# Patient Record
Sex: Female | Born: 1980 | Race: Black or African American | Hispanic: No | Marital: Single | Smoking: Former smoker
Health system: Southern US, Community
[De-identification: ages and names within clinical notes are randomized; demographics above are authoritative.]

## PROBLEM LIST (undated history)

## (undated) DIAGNOSIS — F410 Panic disorder [episodic paroxysmal anxiety] without agoraphobia: Secondary | ICD-10-CM

## (undated) DIAGNOSIS — F419 Anxiety disorder, unspecified: Secondary | ICD-10-CM

## (undated) DIAGNOSIS — E119 Type 2 diabetes mellitus without complications: Secondary | ICD-10-CM

## (undated) DIAGNOSIS — L709 Acne, unspecified: Secondary | ICD-10-CM

## (undated) DIAGNOSIS — R12 Heartburn: Secondary | ICD-10-CM

## (undated) DIAGNOSIS — F32A Depression, unspecified: Secondary | ICD-10-CM

## (undated) DIAGNOSIS — J45909 Unspecified asthma, uncomplicated: Secondary | ICD-10-CM

## (undated) DIAGNOSIS — F329 Major depressive disorder, single episode, unspecified: Secondary | ICD-10-CM

## (undated) DIAGNOSIS — I10 Essential (primary) hypertension: Secondary | ICD-10-CM

## (undated) DIAGNOSIS — K219 Gastro-esophageal reflux disease without esophagitis: Secondary | ICD-10-CM

## (undated) DIAGNOSIS — D649 Anemia, unspecified: Secondary | ICD-10-CM

## (undated) DIAGNOSIS — J302 Other seasonal allergic rhinitis: Secondary | ICD-10-CM

## (undated) HISTORY — PX: TOOTH EXTRACTION: SUR596

## (undated) HISTORY — PX: UTERINE FIBROID EMBOLIZATION: SHX825

---

## 2005-01-03 ENCOUNTER — Other Ambulatory Visit: Admission: RE | Admit: 2005-01-03 | Discharge: 2005-01-03 | Payer: Self-pay | Admitting: Obstetrics and Gynecology

## 2005-07-09 ENCOUNTER — Other Ambulatory Visit: Admission: RE | Admit: 2005-07-09 | Discharge: 2005-07-09 | Payer: Self-pay | Admitting: Obstetrics and Gynecology

## 2006-08-21 ENCOUNTER — Other Ambulatory Visit: Admission: RE | Admit: 2006-08-21 | Discharge: 2006-08-21 | Payer: Self-pay | Admitting: Obstetrics and Gynecology

## 2012-06-17 ENCOUNTER — Other Ambulatory Visit: Payer: Self-pay | Admitting: Nurse Practitioner

## 2012-06-17 DIAGNOSIS — D219 Benign neoplasm of connective and other soft tissue, unspecified: Secondary | ICD-10-CM

## 2012-06-17 DIAGNOSIS — N852 Hypertrophy of uterus: Secondary | ICD-10-CM

## 2012-06-23 ENCOUNTER — Ambulatory Visit (HOSPITAL_COMMUNITY)
Admission: RE | Admit: 2012-06-23 | Discharge: 2012-06-23 | Disposition: A | Discharge status: Home / Self Care | Payer: BC Managed Care – PPO | Source: Ambulatory Visit | Attending: Nurse Practitioner | Admitting: Nurse Practitioner

## 2012-06-23 DIAGNOSIS — D259 Leiomyoma of uterus, unspecified: Secondary | ICD-10-CM | POA: Insufficient documentation

## 2012-06-23 DIAGNOSIS — D219 Benign neoplasm of connective and other soft tissue, unspecified: Secondary | ICD-10-CM

## 2012-06-23 DIAGNOSIS — N852 Hypertrophy of uterus: Secondary | ICD-10-CM | POA: Insufficient documentation

## 2012-07-24 ENCOUNTER — Encounter (HOSPITAL_COMMUNITY): Payer: Self-pay | Admitting: Pharmacist

## 2012-07-28 ENCOUNTER — Encounter (HOSPITAL_COMMUNITY): Payer: Self-pay

## 2012-07-28 ENCOUNTER — Encounter (HOSPITAL_COMMUNITY)
Admission: RE | Admit: 2012-07-28 | Discharge: 2012-07-28 | Disposition: A | Discharge status: Home / Self Care | Payer: BC Managed Care – PPO | Source: Ambulatory Visit | Attending: Obstetrics and Gynecology | Admitting: Obstetrics and Gynecology

## 2012-07-28 DIAGNOSIS — Z01818 Encounter for other preprocedural examination: Secondary | ICD-10-CM | POA: Insufficient documentation

## 2012-07-28 DIAGNOSIS — Z01812 Encounter for preprocedural laboratory examination: Secondary | ICD-10-CM | POA: Insufficient documentation

## 2012-07-28 HISTORY — DX: Other seasonal allergic rhinitis: J30.2

## 2012-07-28 HISTORY — DX: Major depressive disorder, single episode, unspecified: F32.9

## 2012-07-28 HISTORY — DX: Gastro-esophageal reflux disease without esophagitis: K21.9

## 2012-07-28 HISTORY — DX: Essential (primary) hypertension: I10

## 2012-07-28 HISTORY — DX: Depression, unspecified: F32.A

## 2012-07-28 HISTORY — DX: Heartburn: R12

## 2012-07-28 HISTORY — DX: Panic disorder (episodic paroxysmal anxiety): F41.0

## 2012-07-28 HISTORY — DX: Unspecified asthma, uncomplicated: J45.909

## 2012-07-28 HISTORY — DX: Anxiety disorder, unspecified: F41.9

## 2012-07-28 HISTORY — DX: Type 2 diabetes mellitus without complications: E11.9

## 2012-07-28 HISTORY — DX: Anemia, unspecified: D64.9

## 2012-07-28 HISTORY — DX: Acne, unspecified: L70.9

## 2012-07-28 LAB — CBC
Hemoglobin: 10.5 g/dL — ABNORMAL LOW (ref 12.0–15.0)
MCH: 19.6 pg — ABNORMAL LOW (ref 26.0–34.0)
MCV: 67 fL — ABNORMAL LOW (ref 78.0–100.0)
RBC: 5.36 MIL/uL — ABNORMAL HIGH (ref 3.87–5.11)
WBC: 6.8 10*3/uL (ref 4.0–10.5)

## 2012-07-28 LAB — BASIC METABOLIC PANEL
CO2: 27 mEq/L (ref 19–32)
Calcium: 9.6 mg/dL (ref 8.4–10.5)
Chloride: 103 mEq/L (ref 96–112)
Glucose, Bld: 85 mg/dL (ref 70–99)
Sodium: 137 mEq/L (ref 135–145)

## 2012-07-28 NOTE — Patient Instructions (Addendum)
   Your procedure is scheduled on: Tuesday, July 29   Enter through the [employer] [facility] at:  12 Noon Pick up the phone at the desk and dial [phone] and inform us of your arrival.  Please call this number if you have any problems the morning of surgery: [phone]  Remember: Do not eat food after midnight: Monday Do not drink clear liquids after: 930 am Tuesday Take these medicines the morning of surgery with a SIP OF WATER: xanax, abilify, zyrtec, omeprazole.  Patient instructed to bring albuterol inhaler with her on day of surgery.  Do not wear jewelry, make-up, or FINGER nail polish No metal in your hair or on your body. Do not wear lotions, powders, perfumes. You may wear deodorant.  Please use your CHG wash as directed prior to surgery.  Do not shave anywhere for at least 12 hours prior to first CHG shower.  Do not bring valuables to the hospital. Contacts, dentures or bridgework may not be worn into surgery.  Leave suitcase in the car. After Surgery it may be brought to your room. For patients being admitted to the hospital, checkout time is 11:00am the day of discharge.  Home with parents Mr/Ms Vandeventer.

## 2012-07-29 ENCOUNTER — Other Ambulatory Visit: Payer: Self-pay | Admitting: Obstetrics and Gynecology

## 2012-08-04 ENCOUNTER — Inpatient Hospital Stay (HOSPITAL_COMMUNITY)
Admission: RE | Admit: 2012-08-04 | Discharge: 2012-08-07 | DRG: 358 | Disposition: A | Discharge status: Home / Self Care | Payer: BC Managed Care – PPO | Source: Ambulatory Visit | Attending: Obstetrics and Gynecology | Admitting: Obstetrics and Gynecology

## 2012-08-04 ENCOUNTER — Ambulatory Visit (HOSPITAL_COMMUNITY): Payer: BC Managed Care – PPO | Admitting: Anesthesiology

## 2012-08-04 ENCOUNTER — Encounter (HOSPITAL_COMMUNITY)
Admission: RE | Disposition: A | Discharge status: Home / Self Care | Payer: Self-pay | Source: Ambulatory Visit | Attending: Obstetrics and Gynecology

## 2012-08-04 ENCOUNTER — Encounter (HOSPITAL_COMMUNITY): Payer: Self-pay | Admitting: Anesthesiology

## 2012-08-04 DIAGNOSIS — D219 Benign neoplasm of connective and other soft tissue, unspecified: Secondary | ICD-10-CM

## 2012-08-04 DIAGNOSIS — J45909 Unspecified asthma, uncomplicated: Secondary | ICD-10-CM | POA: Diagnosis present

## 2012-08-04 DIAGNOSIS — D259 Leiomyoma of uterus, unspecified: Secondary | ICD-10-CM | POA: Diagnosis present

## 2012-08-04 DIAGNOSIS — IMO0002 Reserved for concepts with insufficient information to code with codable children: Secondary | ICD-10-CM | POA: Diagnosis not present

## 2012-08-04 DIAGNOSIS — N920 Excessive and frequent menstruation with regular cycle: Principal | ICD-10-CM | POA: Diagnosis present

## 2012-08-04 DIAGNOSIS — F329 Major depressive disorder, single episode, unspecified: Secondary | ICD-10-CM | POA: Diagnosis present

## 2012-08-04 DIAGNOSIS — Z9889 Other specified postprocedural states: Secondary | ICD-10-CM

## 2012-08-04 DIAGNOSIS — R34 Anuria and oliguria: Secondary | ICD-10-CM | POA: Diagnosis present

## 2012-08-04 DIAGNOSIS — I10 Essential (primary) hypertension: Secondary | ICD-10-CM | POA: Diagnosis present

## 2012-08-04 DIAGNOSIS — F3289 Other specified depressive episodes: Secondary | ICD-10-CM | POA: Diagnosis present

## 2012-08-04 DIAGNOSIS — Y658 Other specified misadventures during surgical and medical care: Secondary | ICD-10-CM | POA: Diagnosis not present

## 2012-08-04 DIAGNOSIS — F419 Anxiety disorder, unspecified: Secondary | ICD-10-CM | POA: Diagnosis present

## 2012-08-04 DIAGNOSIS — F411 Generalized anxiety disorder: Secondary | ICD-10-CM | POA: Diagnosis present

## 2012-08-04 DIAGNOSIS — D649 Anemia, unspecified: Secondary | ICD-10-CM | POA: Diagnosis present

## 2012-08-04 HISTORY — PX: MYOMECTOMY: SHX85

## 2012-08-04 LAB — BASIC METABOLIC PANEL
CO2: 26 mEq/L (ref 19–32)
Calcium: 9.9 mg/dL (ref 8.4–10.5)
Creatinine, Ser: 0.84 mg/dL (ref 0.50–1.10)
Glucose, Bld: 87 mg/dL (ref 70–99)

## 2012-08-04 LAB — ABO/RH: ABO/RH(D): B POS

## 2012-08-04 LAB — GLUCOSE, CAPILLARY

## 2012-08-04 LAB — CBC
HCT: 23 % — ABNORMAL LOW (ref 36.0–46.0)
MCH: 20.1 pg — ABNORMAL LOW (ref 26.0–34.0)
MCV: 69.1 fL — ABNORMAL LOW (ref 78.0–100.0)
Platelets: 188 10*3/uL (ref 150–400)
RDW: 25.7 % — ABNORMAL HIGH (ref 11.5–15.5)
WBC: 17.2 10*3/uL — ABNORMAL HIGH (ref 4.0–10.5)

## 2012-08-04 LAB — PREPARE RBC (CROSSMATCH)

## 2012-08-04 LAB — HEMOGLOBIN: Hemoglobin: 10.7 g/dL — ABNORMAL LOW (ref 12.0–15.0)

## 2012-08-04 SURGERY — MYOMECTOMY, ABDOMINAL APPROACH
Anesthesia: Choice | Site: Abdomen | Wound class: Clean Contaminated

## 2012-08-04 MED ORDER — PROPOFOL 10 MG/ML IV BOLUS
INTRAVENOUS | Status: DC | PRN
  Administered 2012-08-04: 200 mg via INTRAVENOUS

## 2012-08-04 MED ORDER — ONDANSETRON HCL 4 MG/2ML IJ SOLN
INTRAMUSCULAR | Status: DC | PRN
  Administered 2012-08-04: 4 mg via INTRAVENOUS

## 2012-08-04 MED ORDER — PANTOPRAZOLE SODIUM 40 MG PO TBEC
40.0000 mg | DELAYED_RELEASE_TABLET | Freq: Every day | ORAL | Status: DC
  Administered 2012-08-05 – 2012-08-07 (×2): 40 mg via ORAL
  Filled 2012-08-04 (×4): qty 1

## 2012-08-04 MED ORDER — FENTANYL CITRATE 0.05 MG/ML IJ SOLN
INTRAMUSCULAR | Status: AC
  Filled 2012-08-04: qty 2

## 2012-08-04 MED ORDER — NEOSTIGMINE METHYLSULFATE 1 MG/ML IJ SOLN
INTRAMUSCULAR | Status: DC | PRN
  Administered 2012-08-04: 4 mg via INTRAVENOUS

## 2012-08-04 MED ORDER — CITALOPRAM HYDROBROMIDE 40 MG PO TABS
40.0000 mg | ORAL_TABLET | Freq: Every day | ORAL | Status: DC
  Administered 2012-08-04 – 2012-08-06 (×3): 40 mg via ORAL
  Filled 2012-08-04 (×4): qty 1

## 2012-08-04 MED ORDER — NALOXONE HCL 0.4 MG/ML IJ SOLN: 0.4000 mg | INTRAMUSCULAR | Status: DC | PRN

## 2012-08-04 MED ORDER — PROPOFOL 10 MG/ML IV EMUL
INTRAVENOUS | Status: AC
  Filled 2012-08-04: qty 20

## 2012-08-04 MED ORDER — DIPHENHYDRAMINE HCL 50 MG/ML IJ SOLN: 12.5000 mg | Freq: Four times a day (QID) | INTRAMUSCULAR | Status: DC | PRN

## 2012-08-04 MED ORDER — HYDROMORPHONE 0.3 MG/ML IV SOLN
INTRAVENOUS | Status: DC
  Administered 2012-08-04: 22:00:00 via INTRAVENOUS
  Administered 2012-08-05: 1.8 mg via INTRAVENOUS
  Administered 2012-08-05: 1.5 mg via INTRAVENOUS
  Filled 2012-08-04: qty 25

## 2012-08-04 MED ORDER — HYDROMORPHONE HCL PF 1 MG/ML IJ SOLN
INTRAMUSCULAR | Status: AC
  Administered 2012-08-04: 0.25 mg via INTRAVENOUS
  Filled 2012-08-04: qty 1

## 2012-08-04 MED ORDER — MENTHOL 3 MG MT LOZG: 1.0000 | LOZENGE | OROMUCOSAL | Status: DC | PRN

## 2012-08-04 MED ORDER — LORATADINE 10 MG PO TABS
10.0000 mg | ORAL_TABLET | Freq: Every day | ORAL | Status: DC
  Administered 2012-08-05 – 2012-08-07 (×3): 10 mg via ORAL
  Filled 2012-08-04 (×4): qty 1

## 2012-08-04 MED ORDER — PHENYLEPHRINE HCL 10 MG/ML IJ SOLN
INTRAMUSCULAR | Status: DC | PRN
  Administered 2012-08-04 (×2): 80 ug via INTRAVENOUS
  Administered 2012-08-04 (×2): 40 ug via INTRAVENOUS
  Administered 2012-08-04 (×2): 80 ug via INTRAVENOUS

## 2012-08-04 MED ORDER — FENTANYL CITRATE 0.05 MG/ML IJ SOLN
INTRAMUSCULAR | Status: AC
  Filled 2012-08-04: qty 5

## 2012-08-04 MED ORDER — FUROSEMIDE 10 MG/ML IJ SOLN
INTRAMUSCULAR | Status: AC
  Filled 2012-08-04: qty 2

## 2012-08-04 MED ORDER — DEXAMETHASONE SODIUM PHOSPHATE 10 MG/ML IJ SOLN
INTRAMUSCULAR | Status: AC
  Filled 2012-08-04: qty 1

## 2012-08-04 MED ORDER — MIDAZOLAM HCL 2 MG/2ML IJ SOLN
INTRAMUSCULAR | Status: AC
  Filled 2012-08-04: qty 2

## 2012-08-04 MED ORDER — VASOPRESSIN 20 UNIT/ML IJ SOLN
INTRAVENOUS | Status: DC | PRN
  Administered 2012-08-04: 14:00:00 via INTRAMUSCULAR

## 2012-08-04 MED ORDER — LACTATED RINGERS IV SOLN
INTRAVENOUS | Status: DC
  Administered 2012-08-04 – 2012-08-05 (×3): via INTRAVENOUS

## 2012-08-04 MED ORDER — PHENYLEPHRINE 40 MCG/ML (10ML) SYRINGE FOR IV PUSH (FOR BLOOD PRESSURE SUPPORT)
PREFILLED_SYRINGE | INTRAVENOUS | Status: AC
  Filled 2012-08-04: qty 10

## 2012-08-04 MED ORDER — GLYCOPYRROLATE 0.2 MG/ML IJ SOLN
INTRAMUSCULAR | Status: AC
  Filled 2012-08-04: qty 4

## 2012-08-04 MED ORDER — HYDROMORPHONE HCL PF 1 MG/ML IJ SOLN
0.2500 mg | INTRAMUSCULAR | Status: DC | PRN
  Administered 2012-08-04: 0.5 mg via INTRAVENOUS
  Administered 2012-08-04: 0.25 mg via INTRAVENOUS

## 2012-08-04 MED ORDER — ONDANSETRON HCL 4 MG/2ML IJ SOLN
INTRAMUSCULAR | Status: AC
  Filled 2012-08-04: qty 2

## 2012-08-04 MED ORDER — ALBUTEROL SULFATE HFA 108 (90 BASE) MCG/ACT IN AERS: 2.0000 | INHALATION_SPRAY | RESPIRATORY_TRACT | Status: DC | PRN

## 2012-08-04 MED ORDER — DIPHENHYDRAMINE HCL 12.5 MG/5ML PO ELIX: 12.5000 mg | ORAL_SOLUTION | Freq: Four times a day (QID) | ORAL | Status: DC | PRN

## 2012-08-04 MED ORDER — ROCURONIUM BROMIDE 100 MG/10ML IV SOLN
INTRAVENOUS | Status: DC | PRN
  Administered 2012-08-04 (×2): 10 mg via INTRAVENOUS
  Administered 2012-08-04: 5 mg via INTRAVENOUS
  Administered 2012-08-04: 20 mg via INTRAVENOUS
  Administered 2012-08-04 (×2): 10 mg via INTRAVENOUS
  Administered 2012-08-04: 45 mg via INTRAVENOUS

## 2012-08-04 MED ORDER — DESOGESTREL-ETHINYL ESTRADIOL 0.15-30 MG-MCG PO TABS
1.0000 | ORAL_TABLET | Freq: Every day | ORAL | Status: DC
  Administered 2012-08-06 – 2012-08-07 (×2): 1 via ORAL

## 2012-08-04 MED ORDER — MIDAZOLAM HCL 5 MG/5ML IJ SOLN
INTRAMUSCULAR | Status: DC | PRN
  Administered 2012-08-04: 2 mg via INTRAVENOUS

## 2012-08-04 MED ORDER — NEOSTIGMINE METHYLSULFATE 1 MG/ML IJ SOLN
INTRAMUSCULAR | Status: AC
  Filled 2012-08-04: qty 1

## 2012-08-04 MED ORDER — ROCURONIUM BROMIDE 50 MG/5ML IV SOLN
INTRAVENOUS | Status: AC
Start: 2012-08-04 — End: 2012-08-04
  Filled 2012-08-04: qty 1

## 2012-08-04 MED ORDER — MEPERIDINE HCL 25 MG/ML IJ SOLN: 6.2500 mg | INTRAMUSCULAR | Status: DC | PRN

## 2012-08-04 MED ORDER — HYDROCODONE-ACETAMINOPHEN 5-325 MG PO TABS
1.0000 | ORAL_TABLET | ORAL | Status: DC | PRN
  Administered 2012-08-05 – 2012-08-06 (×4): 2 via ORAL
  Filled 2012-08-04 (×4): qty 2

## 2012-08-04 MED ORDER — ALPRAZOLAM 0.5 MG PO TABS: 0.5000 mg | ORAL_TABLET | Freq: Three times a day (TID) | ORAL | Status: DC | PRN

## 2012-08-04 MED ORDER — LACTATED RINGERS IV SOLN
INTRAVENOUS | Status: DC
  Administered 2012-08-04 (×3): via INTRAVENOUS

## 2012-08-04 MED ORDER — LIDOCAINE HCL (CARDIAC) 20 MG/ML IV SOLN
INTRAVENOUS | Status: AC
  Filled 2012-08-04: qty 5

## 2012-08-04 MED ORDER — ONDANSETRON HCL 4 MG PO TABS: 4.0000 mg | ORAL_TABLET | Freq: Four times a day (QID) | ORAL | Status: DC | PRN

## 2012-08-04 MED ORDER — FENTANYL CITRATE 0.05 MG/ML IJ SOLN
INTRAMUSCULAR | Status: DC | PRN
  Administered 2012-08-04 (×2): 100 ug via INTRAVENOUS
  Administered 2012-08-04 (×3): 50 ug via INTRAVENOUS

## 2012-08-04 MED ORDER — FUROSEMIDE 10 MG/ML IJ SOLN
20.0000 mg | Freq: Once | INTRAMUSCULAR | Status: AC
  Administered 2012-08-04: 20 mg via INTRAVENOUS

## 2012-08-04 MED ORDER — HYDROMORPHONE HCL PF 1 MG/ML IJ SOLN
INTRAMUSCULAR | Status: AC
  Filled 2012-08-04: qty 1

## 2012-08-04 MED ORDER — HYDROMORPHONE HCL PF 1 MG/ML IJ SOLN
INTRAMUSCULAR | Status: DC | PRN
  Administered 2012-08-04 (×2): 1 mg via INTRAVENOUS

## 2012-08-04 MED ORDER — METOCLOPRAMIDE HCL 5 MG/ML IJ SOLN
INTRAMUSCULAR | Status: AC
  Filled 2012-08-04: qty 2

## 2012-08-04 MED ORDER — SODIUM CHLORIDE 0.9 % IJ SOLN: 9.0000 mL | INTRAMUSCULAR | Status: DC | PRN

## 2012-08-04 MED ORDER — PNEUMOCOCCAL VAC POLYVALENT 25 MCG/0.5ML IJ INJ
0.5000 mL | INJECTION | INTRAMUSCULAR | Status: AC
Start: 2012-08-05 — End: 2012-08-06
  Filled 2012-08-04: qty 0.5

## 2012-08-04 MED ORDER — CEFAZOLIN SODIUM-DEXTROSE 2-3 GM-% IV SOLR
INTRAVENOUS | Status: AC
  Filled 2012-08-04: qty 50

## 2012-08-04 MED ORDER — SODIUM CHLORIDE 0.9 % IV SOLN
INTRAVENOUS | Status: DC | PRN
  Administered 2012-08-04 (×2): via INTRAVENOUS

## 2012-08-04 MED ORDER — MINOCYCLINE HCL 100 MG PO CAPS
100.0000 mg | ORAL_CAPSULE | Freq: Two times a day (BID) | ORAL | Status: DC
  Administered 2012-08-05 – 2012-08-07 (×5): 100 mg via ORAL
  Filled 2012-08-04 (×8): qty 1

## 2012-08-04 MED ORDER — ONDANSETRON HCL 4 MG/2ML IJ SOLN: 4.0000 mg | Freq: Four times a day (QID) | INTRAMUSCULAR | Status: DC | PRN

## 2012-08-04 MED ORDER — ONDANSETRON HCL 4 MG/2ML IJ SOLN
4.0000 mg | Freq: Four times a day (QID) | INTRAMUSCULAR | Status: DC | PRN
Start: 2012-08-04 — End: 2012-08-05

## 2012-08-04 MED ORDER — ARIPIPRAZOLE 2 MG PO TABS
2.0000 mg | ORAL_TABLET | Freq: Every day | ORAL | Status: DC
  Administered 2012-08-05 – 2012-08-07 (×3): 2 mg via ORAL
  Filled 2012-08-04 (×4): qty 1

## 2012-08-04 MED ORDER — METOCLOPRAMIDE HCL 5 MG/ML IJ SOLN
10.0000 mg | Freq: Once | INTRAMUSCULAR | Status: AC | PRN
  Administered 2012-08-04: 10 mg via INTRAVENOUS

## 2012-08-04 MED ORDER — CEFAZOLIN SODIUM-DEXTROSE 2-3 GM-% IV SOLR
2.0000 g | INTRAVENOUS | Status: AC
  Administered 2012-08-04: 2 g via INTRAVENOUS

## 2012-08-04 MED ORDER — DOCUSATE SODIUM 100 MG PO CAPS
100.0000 mg | ORAL_CAPSULE | Freq: Two times a day (BID) | ORAL | Status: DC
  Administered 2012-08-05 – 2012-08-07 (×5): 100 mg via ORAL
  Filled 2012-08-04 (×5): qty 1

## 2012-08-04 MED ORDER — SIMETHICONE 80 MG PO CHEW
80.0000 mg | CHEWABLE_TABLET | Freq: Four times a day (QID) | ORAL | Status: DC | PRN
  Administered 2012-08-06: 80 mg via ORAL

## 2012-08-04 MED ORDER — LIDOCAINE HCL (CARDIAC) 20 MG/ML IV SOLN
INTRAVENOUS | Status: DC | PRN
  Administered 2012-08-04: 60 mg via INTRAVENOUS

## 2012-08-04 MED ORDER — DEXAMETHASONE SODIUM PHOSPHATE 4 MG/ML IJ SOLN
INTRAMUSCULAR | Status: DC | PRN
  Administered 2012-08-04: 10 mg via INTRAVENOUS

## 2012-08-04 MED ORDER — GLYCOPYRROLATE 0.2 MG/ML IJ SOLN
INTRAMUSCULAR | Status: DC | PRN
  Administered 2012-08-04: 0.6 mg via INTRAVENOUS
  Administered 2012-08-04: 0.2 mg via INTRAVENOUS

## 2012-08-04 MED ORDER — SODIUM CHLORIDE 0.9 % IV BOLUS (SEPSIS): 500.0000 mL | Freq: Once | INTRAVENOUS | Status: DC

## 2012-08-04 MED ORDER — GLYCOPYRROLATE 0.2 MG/ML IJ SOLN
INTRAMUSCULAR | Status: AC
  Filled 2012-08-04: qty 1

## 2012-08-04 SURGICAL SUPPLY — 46 items
APPLICATOR COTTON TIP 6IN STRL (MISCELLANEOUS) ×2 IMPLANT
BARRIER ADHS 3X4 INTERCEED (GAUZE/BANDAGES/DRESSINGS) ×2 IMPLANT
CANISTER SUCTION 2500CC (MISCELLANEOUS) ×2 IMPLANT
CLOTH BEACON ORANGE TIMEOUT ST (SAFETY) ×2 IMPLANT
CONT PATH 16OZ SNAP LID 3702 (MISCELLANEOUS) ×2 IMPLANT
DECANTER SPIKE VIAL GLASS SM (MISCELLANEOUS) ×2 IMPLANT
DRSG OPSITE POSTOP 4X10 (GAUZE/BANDAGES/DRESSINGS) ×2 IMPLANT
GAUZE SPONGE 4X4 16PLY XRAY LF (GAUZE/BANDAGES/DRESSINGS) ×2 IMPLANT
GLOVE BIOGEL M 6.5 STRL (GLOVE) ×14 IMPLANT
GLOVE BIOGEL PI IND STRL 6.5 (GLOVE) ×7 IMPLANT
GLOVE BIOGEL PI INDICATOR 6.5 (GLOVE) ×7
GLOVE ECLIPSE 7.0 STRL STRAW (GLOVE) ×6 IMPLANT
GLOVE INDICATOR 7.0 STRL GRN (GLOVE) ×6 IMPLANT
GOWN PREVENTION PLUS XLARGE (GOWN DISPOSABLE) ×2 IMPLANT
NEEDLE HYPO 25X1 1.5 SAFETY (NEEDLE) ×2 IMPLANT
NS IRRIG 1000ML POUR BTL (IV SOLUTION) ×4 IMPLANT
PACK ABDOMINAL GYN (CUSTOM PROCEDURE TRAY) ×2 IMPLANT
PAD ABD 7.5X8 STRL (GAUZE/BANDAGES/DRESSINGS) ×2 IMPLANT
PAD OB MATERNITY 4.3X12.25 (PERSONAL CARE ITEMS) ×2 IMPLANT
SPONGE LAP 18X18 X RAY DECT (DISPOSABLE) ×22 IMPLANT
STAPLER VISISTAT 35W (STAPLE) ×2 IMPLANT
SUT MNCRL 0 VIOLET 6X18 (SUTURE) IMPLANT
SUT MNCRL+ AB 3-0 CT1 36 (SUTURE) ×1 IMPLANT
SUT MON AB 2-0 CT1 27 (SUTURE) IMPLANT
SUT MON AB 3-0 SH 27 (SUTURE) ×8
SUT MON AB 3-0 SH27 (SUTURE) ×8 IMPLANT
SUT MONOCRYL 0 6X18 (SUTURE)
SUT MONOCRYL AB 3-0 CT1 36IN (SUTURE) ×1
SUT PDS AB 0 CT 36 (SUTURE) IMPLANT
SUT PDS AB 0 CTX 60 (SUTURE) ×4 IMPLANT
SUT PLAIN 2 0 XLH (SUTURE) ×2 IMPLANT
SUT VIC AB 0 CT1 18XCR BRD8 (SUTURE) ×6 IMPLANT
SUT VIC AB 0 CT1 8-18 (SUTURE) ×6
SUT VIC AB 0 CTX 36 (SUTURE) ×2
SUT VIC AB 0 CTX36XBRD ANBCTRL (SUTURE) ×2 IMPLANT
SUT VIC AB 1 CT1 36 (SUTURE) ×2 IMPLANT
SUT VIC AB 2-0 CT1 27 (SUTURE) ×1
SUT VIC AB 2-0 CT1 TAPERPNT 27 (SUTURE) ×1 IMPLANT
SUT VIC AB 4-0 KS 27 (SUTURE) IMPLANT
SUT VIC AB 4-0 SH 27 (SUTURE)
SUT VIC AB 4-0 SH 27XANBCTRL (SUTURE) IMPLANT
SYR CONTROL 10ML LL (SYRINGE) ×2 IMPLANT
TAPE CLOTH SURG 4X10 WHT LF (GAUZE/BANDAGES/DRESSINGS) ×2 IMPLANT
TOWEL OR 17X24 6PK STRL BLUE (TOWEL DISPOSABLE) ×4 IMPLANT
TRAY FOLEY CATH 14FR (SET/KITS/TRAYS/PACK) ×2 IMPLANT
WATER STERILE IRR 1000ML POUR (IV SOLUTION) ×2 IMPLANT

## 2012-08-04 NOTE — OR Nursing (Signed)
Position of patient checked.  Patient skin warm and dry no noted pressure around extremities. Warming unit in progress.

## 2012-08-04 NOTE — Anesthesia Procedure Notes (Signed)
Procedure Name: Intubation Date/Time: 08/04/2012 1:54 PM Performed by: Isabella Bowens R Pre-anesthesia Checklist: Patient identified, Emergency Drugs available, Suction available, Patient being monitored and Timeout performed Patient Re-evaluated:Patient Re-evaluated prior to inductionOxygen Delivery Method: Circle system utilized Preoxygenation: Pre-oxygenation with 100% oxygen Intubation Type: IV induction Ventilation: Mask ventilation without difficulty Laryngoscope Size: Mac and 3 Grade View: Grade II Tube size: 7.0 mm Number of attempts: 1 Airway Equipment and Method: Stylet Placement Confirmation: ETT inserted through vocal cords under direct vision,  positive ETCO2 and breath sounds checked- equal and bilateral Secured at: 20 cm Tube secured with: Tape Dental Injury: Teeth and Oropharynx as per pre-operative assessment

## 2012-08-04 NOTE — Transfer of Care (Signed)
Immediate Anesthesia Transfer of Care Note  Patient: Alexandria Cole  Procedure(s) Performed: Procedure(s): MYOMECTOMY (N/A)  Patient Location: PACU  Anesthesia Type:General  Level of Consciousness: awake, alert  and oriented  Airway & Oxygen Therapy: Patient Spontanous Breathing and Patient connected to nasal cannula oxygen  Post-op Assessment: Report given to PACU RN and Post -op Vital signs reviewed and stable  Post vital signs: Reviewed and stable  Complications: No apparent anesthesia complications

## 2012-08-04 NOTE — Progress Notes (Addendum)
Day of Surgery POD#0  Procedure(s) (LRB): MYOMECTOMY (N/A)  Subjective: Patient is resting comfortably.  I did not disturb her.  Her mother was at bedside and said she seemed ok when she was awake.      Objective: I have reviewed patient's vital signs and intake and output. UOP in PACU over about 3hrs was 50cc (20cc before lasix and 30cc after lasix) UOP 50cc over the 1hr she has been on the floor  General: sleeping Resp: clear to auscultation bilaterally Cardio: regular rate and rhythm GI: soft, dressing c/d/i, decreased BS, ND Extremities: Homans sign is negative, no sign of DVT and scds are on Scant blood on pad  Hgb 11 at 8p  Assessment: s/p Procedure(s): MYOMECTOMY (N/A): clinically stable  Plan: Will continue to watch UOP Hgb being checked now If UOP < 30 cc/hr (will have urometer bag placed), will give 500cc NS fluid bolus and RN instructed to give me a call Pain is well controlled SCDs for DVT prophylaxis Routine post op care    LOS: 0 days    Ferdinando Lodge Y 08/04/2012, 10:36 PM

## 2012-08-04 NOTE — Op Note (Signed)
08/04/2012  6:41 PM  PATIENT:  Alexandria Cole  32 y.o. female  PRE-OPERATIVE DIAGNOSIS: 1  Uterine Fibroids 2. Menorrhagia 3 . Anemia   POST-OPERATIVE DIAGNOSIS:  Same   PROCEDURE:  Procedure(s): MYOMECTOMY (N/A)  SURGEON:  Surgeon(s) and Role:    * Ladasia Sircy J. Richardson Dopp, MD - Primary    * Geryl Rankins, MD - Assisting  PHYSICIAN ASSISTANT: NOne    ASSISTANTS: Dr. Geryl Rankins    ANESTHESIA:   general  EBL:  Total I/O In: 5500 [I.V.:4800; Blood:700] Out: 1555 [Urine:355; Blood:1200]  BLOOD ADMINISTERED:2 units of packed RBC's   DRAINS: Urinary Catheter (Foley)   LOCAL MEDICATIONS USED:  NONE  SPECIMEN:  Source of Specimen:  uterine fibroids   DISPOSITION OF SPECIMEN:  PATHOLOGY  COUNTS:  YES  TOURNIQUET:  * No tourniquets in log *  DICTATION: .Dragon Dictation  PLAN OF CARE: Admit to inpatient   PATIENT DISPOSITION:  PACU - hemodynamically stable.   Delay start of Pharmacological VTE agent (>24hrs) due to surgical blood loss or risk of bleeding: not applicable   Findings multiple uterine fibroids .Marland Kitchen Normal fallopian tubes and ovaries.   Procedure: Patient was taken to the operating room where she was placed and general anesthesia. Timeout was performed. She was prepped and draped in the usual sterile fashion. A midline incision was made with the scalpel. The incision was extended to the fascia with the Bovie. The fascia was incised and extended superiorly inferiorly. The peritoneum was identified and entered sharply. The peritoneal incision was extended superiorly inferiorly with good visualization of the bladder. The uterus was exteriorized. Patient was noted to have multiple uterine fibroids anteriorly posteriorly as well as fundal.  The anterior portion of the uterus was injected with vasopressin vertical incision was made approximately 10 cm in length multiple fibroids were removed via blunt and sharp dissection through this incision. The incision was  reapproximated with interrupted suture of 0 Vicryl. Several layers of suture were used to obtain hemostasis. The uterine serosa was reapproximated with 4-0 Monocryl. Attention was turned to the posterior portion of the uterus where a vertical incision was made approximately 10 cm in length. Again multiple uterine fibroids removed via blunt and sharp dissection. The endometrium was entered to removal of a posterior fibroid. This was in was reapproximated with 4-0 Monocryl. The myometrium was reapproximated with interrupted sutures of 0 Vicryl.  Patient was noted to have bleeding from the previous anterior incision with formation of what appeared to be a hematoma the previous incision was reopened sites of bleeding were noted and hemostasis was obtained with interrupted sutures of 0 Vicryl. The myometrium the anterior incision was reapproximated with interrupted 0 Vicryl suture. Excellent hemostasis was noted. The serosa was then reapproximated with 4-0 Monocryl.  Attention was turned to the posterior incision where the serosa was reapproximated with 4-0 Monocryl. During the surgery the patient had approximately 1200 cc of blood loss she received 2 units of packed red blood cells intraoperatively hemoglobin prior to transfusion was 6.7.  Interceed was placed along the uterine incisions. The uterus was returned to the pelvis. The fascia was reapproximated with 0 PDS in a running fashion. The subcutaneous adipose tissue was reapproximated with 2-0 Vicryl. The skin was closed with staples. Sponge lap needle counts were correct x2. The patient was taken to the recovery room awake and in stable condition.  EBL 1200 ml UOP 355 ml  Fluids 4000 ML of Crystalloid and 2 units of packed red blood cells.

## 2012-08-04 NOTE — Anesthesia Preprocedure Evaluation (Addendum)
Anesthesia Evaluation  Patient identified by MRN, date of birth, ID band Patient awake    Reviewed: Allergy & Precautions, H&P , NPO status   Airway Mallampati: III TM Distance: >3 FB Neck ROM: Full    Dental  (+) Chipped,    Pulmonary asthma , Current Smoker,  breath sounds clear to auscultation  Pulmonary exam normal       Cardiovascular hypertension, Rhythm:Regular Rate:Normal     Neuro/Psych PSYCHIATRIC DISORDERS Anxiety Depression Panic Attacks   GI/Hepatic Neg liver ROS, GERD-  Medicated and Controlled,  Endo/Other  diabetes, Well Controlled, Type 2Obesity  Renal/GU negative Renal ROS  negative genitourinary   Musculoskeletal negative musculoskeletal ROS (+)   Abdominal Normal abdominal exam  (+)   Peds  Hematology  (+) Blood dyscrasia, anemia ,   Anesthesia Other Findings   Reproductive/Obstetrics Fibroid Uterus                         Anesthesia Physical Anesthesia Plan  ASA: III  Anesthesia Plan: General   Post-op Pain Management:    Induction: Intravenous  Airway Management Planned: Oral ETT  Additional Equipment:   Intra-op Plan:   Post-operative Plan: Extubation in OR  Informed Consent: I have reviewed the patients History and Physical, chart, labs and discussed the procedure including the risks, benefits and alternatives for the proposed anesthesia with the patient or authorized representative who has indicated his/her understanding and acceptance.   Dental advisory given  Plan Discussed with: Anesthesiologist, CRNA and Surgeon  Anesthesia Plan Comments:         Anesthesia Quick Evaluation

## 2012-08-04 NOTE — Anesthesia Postprocedure Evaluation (Signed)
  Anesthesia Post Note  Patient: Alexandria Cole  Procedure(s) Performed: Procedure(s) (LRB): MYOMECTOMY (N/A)  Anesthesia type: GA  Patient location: PACU  Post pain: Pain level controlled  Post assessment: Post-op Vital signs reviewed  Last Vitals:  Filed Vitals:   08/04/12 1930  BP: 132/88  Pulse: 95  Temp: 37.3 C  Resp: 12    Post vital signs: Reviewed  Level of consciousness: sedated  Complications: No apparent anesthesia complications

## 2012-08-04 NOTE — H&P (Signed)
Date of Initial H&P: 07/29/2012  History reviewed, patient examined, no change in status, stable for surgery. Pt has  Signed an informed consent for blood products if necessary.

## 2012-08-05 DIAGNOSIS — N920 Excessive and frequent menstruation with regular cycle: Secondary | ICD-10-CM | POA: Diagnosis present

## 2012-08-05 DIAGNOSIS — I10 Essential (primary) hypertension: Secondary | ICD-10-CM | POA: Diagnosis present

## 2012-08-05 DIAGNOSIS — F419 Anxiety disorder, unspecified: Secondary | ICD-10-CM | POA: Diagnosis present

## 2012-08-05 DIAGNOSIS — D219 Benign neoplasm of connective and other soft tissue, unspecified: Secondary | ICD-10-CM | POA: Diagnosis present

## 2012-08-05 DIAGNOSIS — D649 Anemia, unspecified: Secondary | ICD-10-CM | POA: Diagnosis present

## 2012-08-05 DIAGNOSIS — J45909 Unspecified asthma, uncomplicated: Secondary | ICD-10-CM | POA: Diagnosis present

## 2012-08-05 LAB — BASIC METABOLIC PANEL
BUN: 13 mg/dL (ref 6–23)
Calcium: 7.3 mg/dL — ABNORMAL LOW (ref 8.4–10.5)
GFR calc Af Amer: 39 mL/min — ABNORMAL LOW (ref >90)
GFR calc non Af Amer: 33 mL/min — ABNORMAL LOW (ref >90)
Glucose, Bld: 146 mg/dL — ABNORMAL HIGH (ref 70–99)
Sodium: 131 mEq/L — ABNORMAL LOW (ref 135–145)

## 2012-08-05 LAB — TYPE AND SCREEN

## 2012-08-05 LAB — HEMOGLOBIN AND HEMATOCRIT, BLOOD
HCT: 34.7 % — ABNORMAL LOW (ref 36.0–46.0)
Hemoglobin: 11 g/dL — ABNORMAL LOW (ref 12.0–15.0)

## 2012-08-05 MED ORDER — LACTATED RINGERS IV BOLUS (SEPSIS)
500.0000 mL | Freq: Once | INTRAVENOUS | Status: AC
  Administered 2012-08-05: 500 mL via INTRAVENOUS

## 2012-08-05 NOTE — Progress Notes (Signed)
1 Day Post-Op s/p Abdominal myomectomy  Subjective: Pt reports pain is controlled with PCA. She denies flatus . No nausea or vomiting. Slightly dizzy last night when trying to ambulate however this resolved.. Has pain under right rib that feels like gas pain.  She had minimal urine output overnight.   Objective: Vital signs in last 24 hours: Temp:  [98 F (36.7 C)-99.2 F (37.3 C)] 98.6 F (37 C) (07/30 0941) Pulse Rate:  [85-102] 85 (07/30 0941) Resp:  [12-22] 18 (07/30 0941) BP: (119-151)/(64-89) 138/82 mmHg (07/30 0941) SpO2:  [98 %-100 %] 99 % (07/30 0941) Weight:  [98.884 kg (218 lb)] 98.884 kg (218 lb) (07/29 2258) Last BM Date: 08/04/12  Intake/Output from previous day: 07/29 0701 - 07/30 0700 In: 8764.2 [P.O.:840; I.V.:7224.2; Blood:700] Out: 1862 [Urine:662; Blood:1200] Intake/Output this shift: Total I/O In: -  Out: 343 [Urine:343]  General appearance: alert and no distress Cardio: regular rate and rhythm, S1, S2 normal, no murmur, click, rub or gallop GI: soft appropriately tender slightly distended +BS.Marland Kitchen bandage is clean dry and intact.  Extremities: extremities normal, atraumatic, no cyanosis or edema  Lab Results:   Recent Labs  08/04/12 1650 08/04/12 1954 08/04/12 2235 08/05/12 0540  WBC 17.2*  --   --  16.6*  HGB 6.7* 11.0* 10.7* 8.9*  HCT 23.0* 34.7*  --  27.8*  PLT 188  --   --  118*   BMET  Recent Labs  08/04/12 1230 08/05/12 0540  NA 137 131*  K 4.3 6.1*  CL 103 104  CO2 26 22  GLUCOSE 87 146*  BUN 5* 13  CREATININE 0.84 1.91*  CALCIUM 9.9 7.3*   PT/INR No results found for this basename: LABPROT, INR,  in the last 72 hours ABG No results found for this basename: PHART, PCO2, PO2, HCO3,  in the last 72 hours  Studies/Results: No results found.  Anti-infectives: Anti-infectives   Start     Dose/Rate Route Frequency Ordered Stop   08/04/12 2215  minocycline (MINOCIN,DYNACIN) capsule 100 mg     100 mg Oral 2 times daily 08/04/12  2207     08/04/12 1249  ceFAZolin (ANCEF) 2-3 GM-% IVPB SOLR    Comments:  KASMAR,  NANCY G: cabinet override      08/04/12 1249 08/05/12 0059   08/04/12 0148  ceFAZolin (ANCEF) IVPB 2 g/50 mL premix     2 g 100 mL/hr over 30 Minutes Intravenous On call to O.R. 08/04/12 0148 08/04/12 1348      Assessment/Plan: s/p Procedure(s): MYOMECTOMY (N/A) Advance diet Continue foley due to urinary output monitoring Continue IV fluids due to oliguria.  Increased creatinine most likely due to blood loss during surgery... The urine output is improving.. Repeat creatinine in am  And repeat hgb in am;.  Encourage ambulation.  D/c dilaudid pca this afternoon  LOS: 1 day    Alexandria Cole J. 08/05/2012

## 2012-08-05 NOTE — Progress Notes (Signed)
Ur chart review completed.  

## 2012-08-05 NOTE — Anesthesia Postprocedure Evaluation (Signed)
  Anesthesia Post-op Note  Anesthesia Post Note  Patient: Alexandria Cole  Procedure(s) Performed: Procedure(s) (LRB): MYOMECTOMY (N/A)  Anesthesia type: General  Patient location: [facility]  Post pain: Pain level controlled  Post assessment: Post-op Vital signs reviewed  Last Vitals:  Filed Vitals:   08/05/12 0629  BP:   Pulse:   Temp:   Resp: 20    Post vital signs: Reviewed  Level of consciousness: sedated  Complications: No apparent anesthesia complications

## 2012-08-06 LAB — CBC
HCT: 27.8 % — ABNORMAL LOW (ref 36.0–46.0)
Hemoglobin: 8.9 g/dL — ABNORMAL LOW (ref 12.0–15.0)
MCH: 24 pg — ABNORMAL LOW (ref 26.0–34.0)
MCH: 23.9 pg — ABNORMAL LOW (ref 26.0–34.0)
MCHC: 32 g/dL (ref 30.0–36.0)
MCHC: 32 g/dL (ref 30.0–36.0)
Platelets: 121 10*3/uL — ABNORMAL LOW (ref 150–400)
RDW: 23.9 % — ABNORMAL HIGH (ref 11.5–15.5)

## 2012-08-06 LAB — BASIC METABOLIC PANEL
Calcium: 7.6 mg/dL — ABNORMAL LOW (ref 8.4–10.5)
GFR calc Af Amer: 56 mL/min — ABNORMAL LOW (ref >90)
GFR calc non Af Amer: 48 mL/min — ABNORMAL LOW (ref >90)
Potassium: 4.2 mEq/L (ref 3.5–5.1)
Sodium: 138 mEq/L (ref 135–145)

## 2012-08-06 MED ORDER — IBUPROFEN 600 MG PO TABS
600.0000 mg | ORAL_TABLET | Freq: Four times a day (QID) | ORAL | Status: DC
  Administered 2012-08-06 – 2012-08-07 (×6): 600 mg via ORAL
  Filled 2012-08-06 (×6): qty 1

## 2012-08-06 MED ORDER — HYDROMORPHONE HCL PF 1 MG/ML IJ SOLN
1.0000 mg | INTRAMUSCULAR | Status: DC | PRN
  Administered 2012-08-06: 1 mg via INTRAVENOUS
  Filled 2012-08-06: qty 1

## 2012-08-06 MED ORDER — BISACODYL 10 MG RE SUPP
10.0000 mg | Freq: Once | RECTAL | Status: AC
  Administered 2012-08-06: 10 mg via RECTAL
  Filled 2012-08-06: qty 1

## 2012-08-06 NOTE — Progress Notes (Signed)
2 Days Post-Op  S/p Abdominal myomectomy with intra operative hemorrhage of 1200 ml requiring 2 units of pRBC transfusion  Subjective: Pain 5 out of ten with vidodin alone. Pain 7 when ambulating. No flatus. Tolerating po no nausea or emesis.  Voiding without difficulty. No dizziness no chest pain. Reports gas pain.   Objective: Vital signs in last 24 hours: Temp:  [97.9 F (36.6 C)-99.1 F (37.3 C)] 99.1 F (37.3 C) (07/31 0945) Pulse Rate:  [83-101] 92 (07/31 0945) Resp:  [16-18] 18 (07/31 0945) BP: (120-147)/(62-81) 132/78 mmHg (07/31 0945) SpO2:  [96 %-100 %] 98 % (07/31 0945) Last BM Date: 08/04/12  Intake/Output from previous day: 07/30 0701 - 07/31 0700 In: 760 [P.O.:760] Out: 4108 [Urine:4108] Intake/Output this shift: Total I/O In: -  Out: 950 [Urine:950]  General appearance: alert and cooperative Resp: clear to auscultation bilaterally Cardio: regular rate and rhythm, S1, S2 normal, no murmur, click, rub or gallop GI: soft appropriately tender nondistended + BS no rebound no guarding. .. bandage is clean dry and intact  Extremities: extremities normal, atraumatic, no cyanosis or edema  Lab Results:   Recent Labs  08/05/12 0540 08/06/12 0525  WBC 16.6* 14.3*  HGB 8.9* 7.4*  HCT 27.8* 23.1*  PLT 118* 121*   BMET  Recent Labs  08/05/12 0540 08/06/12 0525  NA 131* 138  K 6.1* 4.2  CL 104 105  CO2 22 26  GLUCOSE 146* 125*  BUN 13 11  CREATININE 1.91* 1.40*  CALCIUM 7.3* 7.6*   PT/INR No results found for this basename: LABPROT, INR,  in the last 72 hours ABG No results found for this basename: PHART, PCO2, PO2, HCO3,  in the last 72 hours  Studies/Results: No results found.  Anti-infectives: Anti-infectives   Start     Dose/Rate Route Frequency Ordered Stop   08/04/12 2215  minocycline (MINOCIN,DYNACIN) capsule 100 mg     100 mg Oral 2 times daily 08/04/12 2207     08/04/12 1249  ceFAZolin (ANCEF) 2-3 GM-% IVPB SOLR    Comments:  KASMAR,   NANCY G: cabinet override      08/04/12 1249 08/05/12 0059   08/04/12 0148  ceFAZolin (ANCEF) IVPB 2 g/50 mL premix     2 g 100 mL/hr over 30 Minutes Intravenous On call to O.R. 08/04/12 0148 08/04/12 1348      Assessment/Plan: s/p Procedure(s): MYOMECTOMY (N/A) Plan for discharge tomorrow awaiting return of bowel function and plan to optomize pain control  Dulcolax suppository x 1 now  Encourage ambulation.  Creatinine is improving. Repeat bmet in am   Anemia. Start Iron bid HGb slightly declined most likely dilutional from IV fluids. Plan repeat in am   LOS: 2 days    Merleen Picazo J. 08/06/2012

## 2012-08-07 LAB — CBC
HCT: 23.3 % — ABNORMAL LOW (ref 36.0–46.0)
Hemoglobin: 7.3 g/dL — ABNORMAL LOW (ref 12.0–15.0)
Platelets: 129 10*3/uL — ABNORMAL LOW (ref 150–400)
RDW: 25.1 % — ABNORMAL HIGH (ref 11.5–15.5)
RDW: 24.8 % — ABNORMAL HIGH (ref 11.5–15.5)
WBC: 12.9 10*3/uL — ABNORMAL HIGH (ref 4.0–10.5)
WBC: 13.4 10*3/uL — ABNORMAL HIGH (ref 4.0–10.5)

## 2012-08-07 LAB — BASIC METABOLIC PANEL
Chloride: 106 mEq/L (ref 96–112)
Creatinine, Ser: 1.05 mg/dL (ref 0.50–1.10)
GFR calc Af Amer: 79 mL/min — ABNORMAL LOW (ref >90)
GFR calc non Af Amer: 68 mL/min — ABNORMAL LOW (ref >90)
Potassium: 3.5 mEq/L (ref 3.5–5.1)

## 2012-08-07 MED ORDER — FERROUS SULFATE 325 (65 FE) MG PO TABS
325.0000 mg | ORAL_TABLET | ORAL | Status: DC
  Administered 2012-08-07 (×2): 325 mg via ORAL
  Filled 2012-08-07 (×2): qty 1

## 2012-08-07 MED ORDER — FERROUS SULFATE 325 (65 FE) MG PO TABS: 325.0000 mg | ORAL_TABLET | Freq: Three times a day (TID) | ORAL | Status: DC

## 2012-08-07 MED ORDER — IBUPROFEN 600 MG PO TABS: 600.0000 mg | ORAL_TABLET | Freq: Four times a day (QID) | ORAL | Status: DC | PRN

## 2012-08-07 MED ORDER — HYDROCODONE-ACETAMINOPHEN 5-325 MG PO TABS: 1.0000 | ORAL_TABLET | Freq: Four times a day (QID) | ORAL | Status: DC | PRN

## 2012-08-07 NOTE — Discharge Summary (Signed)
Physician Discharge Summary  Patient ID: Alexandria Cole MRN: [id number] DOB/AGE: 34-Apr-1982 31 y.o.  Admit date: 08/04/2012 Discharge date: 08/07/2012  Admission Diagnoses:1. Uterine fibroids 2. Anemia 3. Menorrhagia 4. Hypertension 5. Asthma 6. Anxiety and depression   Discharge Diagnoses: s/p myomectomy  Active Problems:   Fibroids   Menorrhagia   Anemia   High blood pressure   Asthma   Anxiety and depression   Discharged Condition: stable  Hospital Course: Pt was admitted after abdominal myomectomy on 08/04/2012. She had an intraoperative hemorrhage of 1200 ml. She received 2 units of packed rbc's during surgery. She developed oliguria postoperatively with increased creatinine of 1.9 .. This decreased to 1.0 by pod #3. She had moderate vaginal bleeding after surgery. Her hgb at discharge was 7.3. She was asymptomatic. Iron supplementation was initiated.   Consults: None  Significant Diagnostic Studies: labs: hgb 7.3 on discharge   Treatments: surgery: abdominal myomectomy   Discharge Exam: Blood pressure 145/60, pulse 92, temperature 98.8 F (37.1 C), temperature source Oral, resp. rate 18, height 5\' 3"  (1.6 m), weight 98.884 kg (218 lb), last menstrual period 07/17/2012, SpO2 100.00%.  Disposition: Final discharge disposition not confirmed  Discharge Orders   Future Orders Complete By Expires     Call MD for:  persistant nausea and vomiting  As directed     Call MD for:  redness, tenderness, or signs of infection (pain, swelling, redness, odor or green/yellow discharge around incision site)  As directed     Call MD for:  severe uncontrolled pain  As directed     Call MD for:  temperature >100.4  As directed     Diet - low sodium heart healthy  As directed     Driving Restrictions  As directed     Comments:      Avoid driving for 2 weeks    Increase activity slowly  As directed     Lifting restrictions  As directed     Comments:      Avoid lifting over 10 lbs    Sexual Activity Restrictions  As directed     Comments:      Avoid sex in 6 weeks        Medication List         adapalene 0.1 % gel  Commonly known as:  DIFFERIN  Apply 1 application topically daily as needed (for acne).     albuterol 108 (90 BASE) MCG/ACT inhaler  Commonly known as:  PROVENTIL HFA;VENTOLIN HFA  Inhale 2 puffs into the lungs every 6 (six) hours as needed (allergies).     ALPRAZolam 0.5 MG tablet  Commonly known as:  XANAX  Take 0.5 mg by mouth daily as needed for anxiety.     ARIPiprazole 2 MG tablet  Commonly known as:  ABILIFY  Take 2 mg by mouth daily.     Biotin 10 MG Caps  Take 10 mg by mouth daily.     calcium carbonate 500 MG chewable tablet  Commonly known as:  TUMS - dosed in mg elemental calcium  Chew 1 tablet by mouth daily as needed for heartburn.     cetirizine 10 MG tablet  Commonly known as:  ZYRTEC  Take 10 mg by mouth daily.     citalopram 40 MG tablet  Commonly known as:  CELEXA  Take 40 mg by mouth at bedtime.     desogestrel-ethinyl estradiol 0.15-30 MG-MCG tablet  Commonly known as:  APRI,EMOQUETTE,SOLIA  Take 1 tablet by  mouth daily.     docusate sodium 100 MG capsule  Commonly known as:  COLACE  Take 200 mg by mouth daily.     estazolam 2 MG tablet  Commonly known as:  PROSOM  Take 4 mg by mouth at bedtime.     HYDROcodone-acetaminophen 5-325 MG per tablet  Commonly known as:  NORCO/VICODIN  Take 1-2 tablets by mouth every 6 (six) hours as needed.     ibuprofen 600 MG tablet  Commonly known as:  ADVIL,MOTRIN  Take 1 tablet (600 mg total) by mouth every 6 (six) hours as needed for pain.     IRON PO  Take 2 tablets by mouth daily.     minocycline 100 MG capsule  Commonly known as:  MINOCIN,DYNACIN  Take 100 mg by mouth 2 (two) times daily.     naphazoline-pheniramine 0.025-0.3 % ophthalmic solution  Commonly known as:  NAPHCON-A  Place 1 drop into both eyes daily as needed (for itchy eyes).     omeprazole 20  MG capsule  Commonly known as:  PRILOSEC  Take 20 mg by mouth daily.     oxymetazoline 0.05 % nasal spray  Commonly known as:  AFRIN  Place 2 sprays into the nose 2 (two) times a week.           Follow-up Information   Follow up with Jessee Avers., MD. Schedule an appointment as soon as possible for a visit on 08/11/2012. (staple removal )    Contact information:   [address] [place] [address] [phone]       Signed: Jessee Avers. 08/07/2012, 5:20 PM

## 2012-08-07 NOTE — Progress Notes (Signed)
Discharge instructions provided to patient at bedside.  Activity, medications, when to call the doctor, follow up appointments, incision care and community resources discussed.  No questions at this time.  Patient left unit in stable condition with all personal belongings in stable condition with prescriptions.  Osvaldo Angst, RN--------------

## 2012-08-07 NOTE — Progress Notes (Signed)
3 Days Post-Op s/p Myomectomy with 1200 ml intraoperative hemorrhage.  Subjective: Pt had vaginal bleeding that was  Moderate for the last 3 days... She reports scant bleeding today. Denies headache or dizziness. " feels sleepy"  Objective: Vital signs in last 24 hours: Temp:  [98 F (36.7 C)-99.9 F (37.7 C)] 98.8 F (37.1 C) (08/01 1400) Pulse Rate:  [82-94] 92 (08/01 1400) Resp:  [18-20] 18 (08/01 1400) BP: (124-145)/(60-82) 145/60 mmHg (08/01 1400) SpO2:  [98 %-100 %] 100 % (08/01 1400) Last BM Date: 09/04/12  Intake/Output from previous day: 07/31 0701 - 08/01 0700 In: -  Out: 2950 [Urine:2950] Intake/Output this shift: Total I/O In: 480 [P.O.:480] Out: -   General appearance: alert and cooperative Resp: clear to auscultation bilaterally Cardio: regular rate and rhythm, S1, S2 normal, no murmur, click, rub or gallop GI: soft appropriately tender nondistended +BS .. incision is healing well.. no exudate no erythema  Extremities: extremities normal, atraumatic, no cyanosis or edema  Lab Results:   Recent Labs  08/07/12 0505 08/07/12 1205  WBC 12.9* 13.4*  HGB 6.4* 7.3*  HCT 20.6* 23.3*  PLT 129* 167   BMET  Recent Labs  08/06/12 0525 08/07/12 0505  NA 138 136  K 4.2 3.5  CL 105 106  CO2 26 23  GLUCOSE 125* 89  BUN 11 9  CREATININE 1.40* 1.05  CALCIUM 7.6* 7.7*   PT/INR No results found for this basename: LABPROT, INR,  in the last 72 hours ABG No results found for this basename: PHART, PCO2, PO2, HCO3,  in the last 72 hours  Studies/Results: No results found.  Anti-infectives: Anti-infectives   Start     Dose/Rate Route Frequency Ordered Stop   08/04/12 2215  minocycline (MINOCIN,DYNACIN) capsule 100 mg     100 mg Oral 2 times daily 08/04/12 2207     08/04/12 1249  ceFAZolin (ANCEF) 2-3 GM-% IVPB SOLR    Comments:  KASMAR,  NANCY G: cabinet override      08/04/12 1249 08/05/12 0059   08/04/12 0148  ceFAZolin (ANCEF) IVPB 2 g/50 mL premix      2 g 100 mL/hr over 30 Minutes Intravenous On call to O.R. 08/04/12 0148 08/04/12 1348      Assessment/Plan: s/p Procedure(s): MYOMECTOMY (N/A) Discharge continue iron therapy at home plan for stable removal in the office on tuesday August 5th  LOS: 3 days    Ivey Cina J. 08/07/2012

## 2013-12-13 ENCOUNTER — Other Ambulatory Visit (HOSPITAL_COMMUNITY)
Admission: RE | Admit: 2013-12-13 | Discharge: 2013-12-13 | Disposition: A | Discharge status: Home / Self Care | Payer: BC Managed Care – PPO | Source: Ambulatory Visit | Attending: Obstetrics and Gynecology | Admitting: Obstetrics and Gynecology

## 2013-12-13 DIAGNOSIS — R87810 Cervical high risk human papillomavirus (HPV) DNA test positive: Secondary | ICD-10-CM | POA: Insufficient documentation

## 2013-12-13 DIAGNOSIS — Z1151 Encounter for screening for human papillomavirus (HPV): Secondary | ICD-10-CM | POA: Insufficient documentation

## 2013-12-13 DIAGNOSIS — Z01419 Encounter for gynecological examination (general) (routine) without abnormal findings: Secondary | ICD-10-CM | POA: Diagnosis not present

## 2013-12-14 ENCOUNTER — Other Ambulatory Visit: Payer: Self-pay | Admitting: Obstetrics and Gynecology

## 2013-12-20 LAB — CYTOLOGY - PAP

## 2014-05-10 IMAGING — US US TRANSVAGINAL NON-OB
1 series · 13 of 25 positions shown · non-contrast
Comparison: None.

CLINICAL DATA: Enlarged uterus.  Fibroids.  LMP 05/20/2012



[Series 1: us pelvis complete · 13 of 50 slices shown]
[im 1/50]
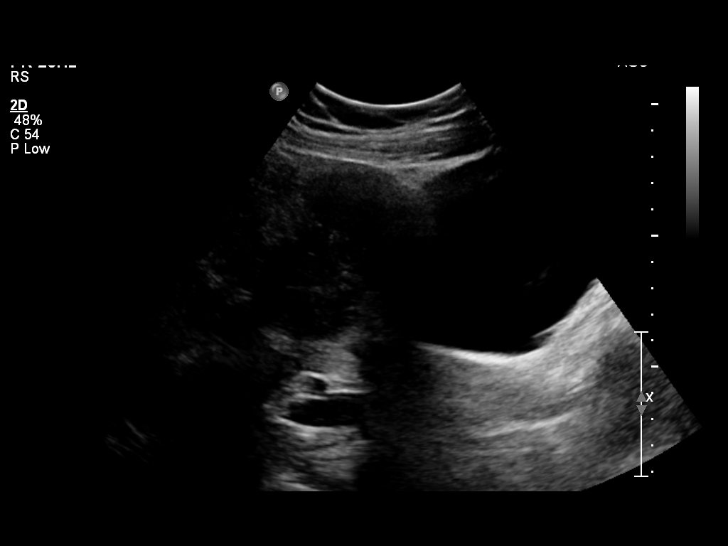
[im 5/50]
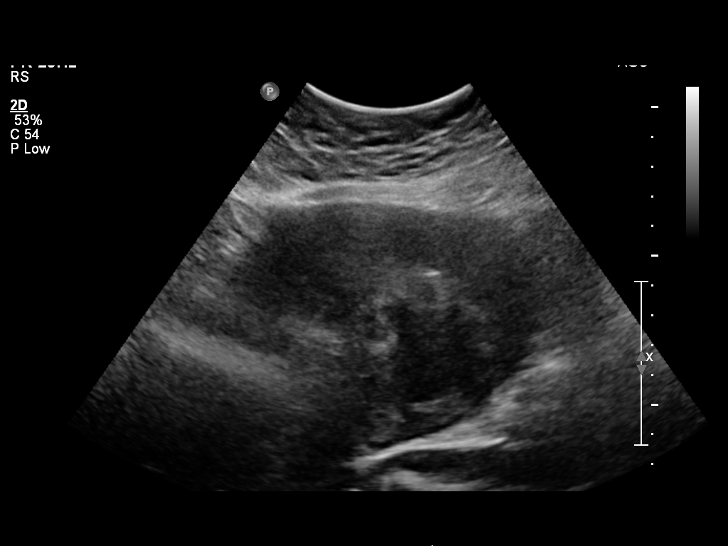
[im 9/50]
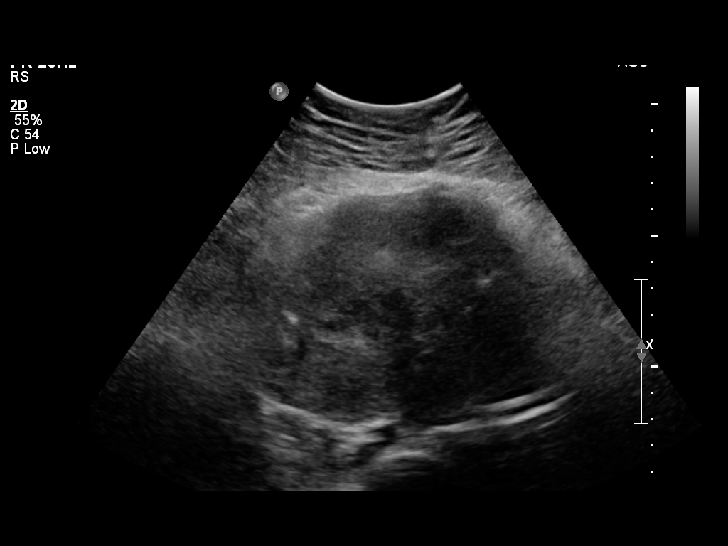
[im 13/50]
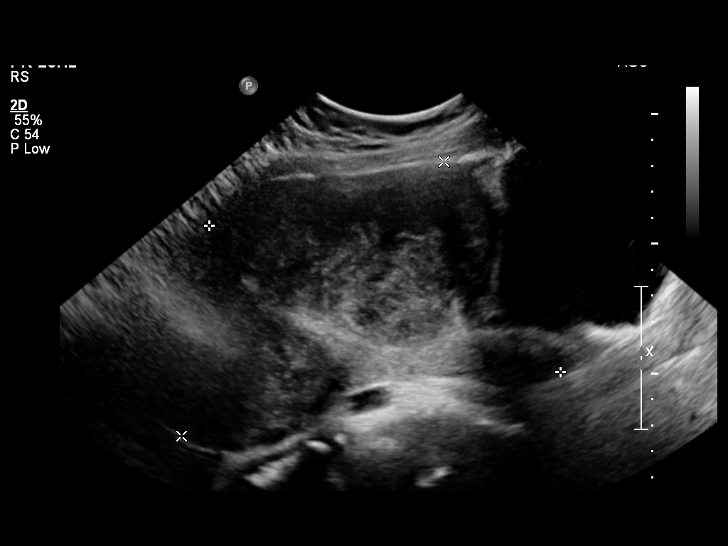
[im 17/50]
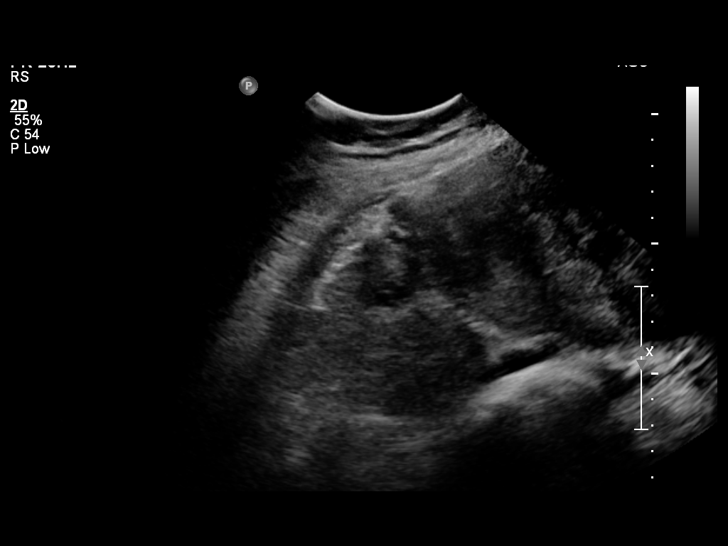
[im 21/50]
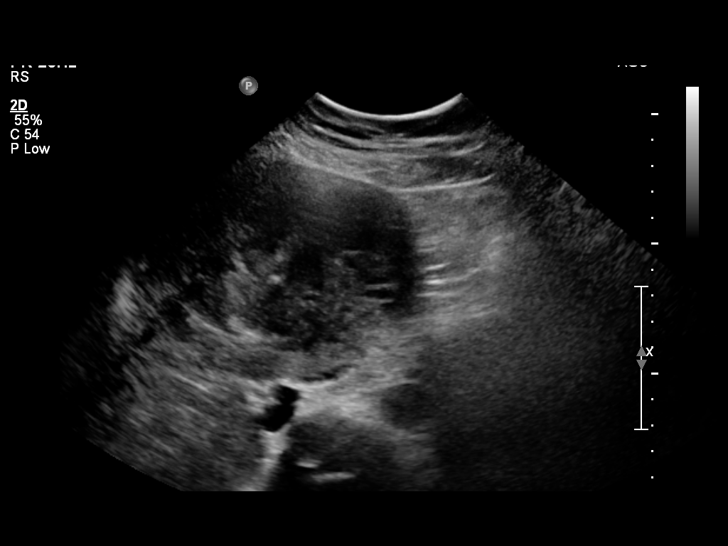
[im 25/50]
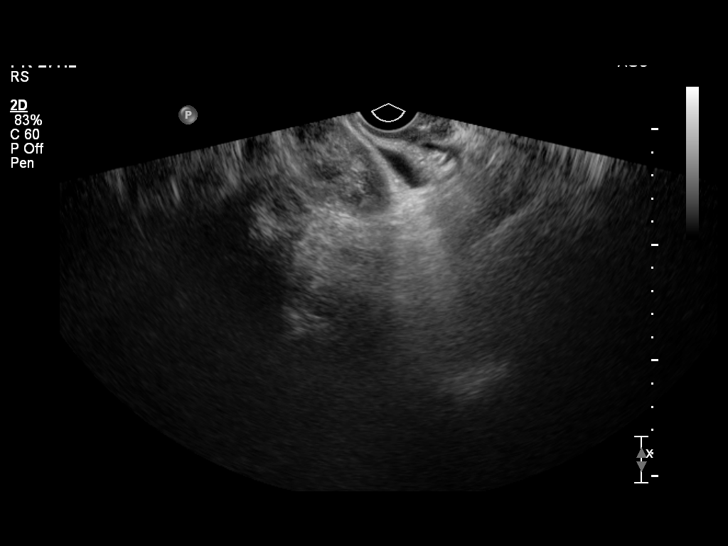
[im 29/50]
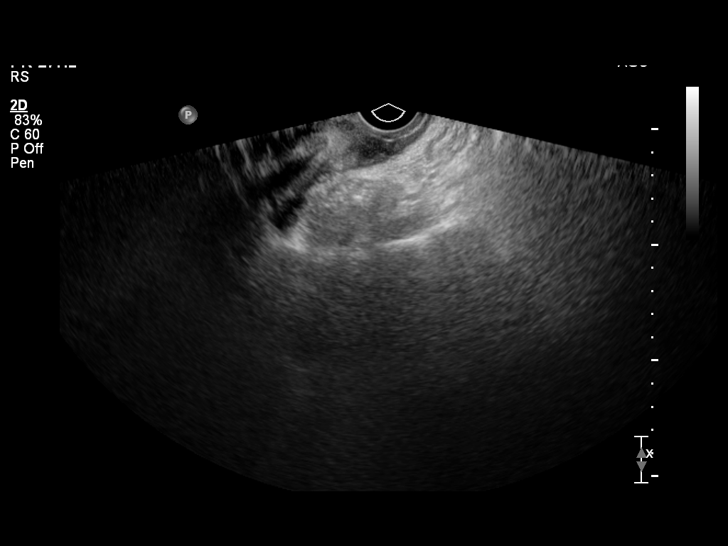
[im 33/50]
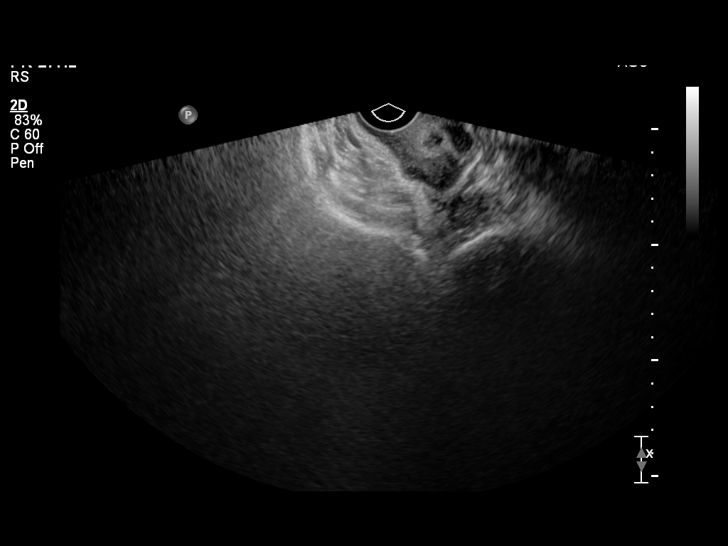
[im 37/50]
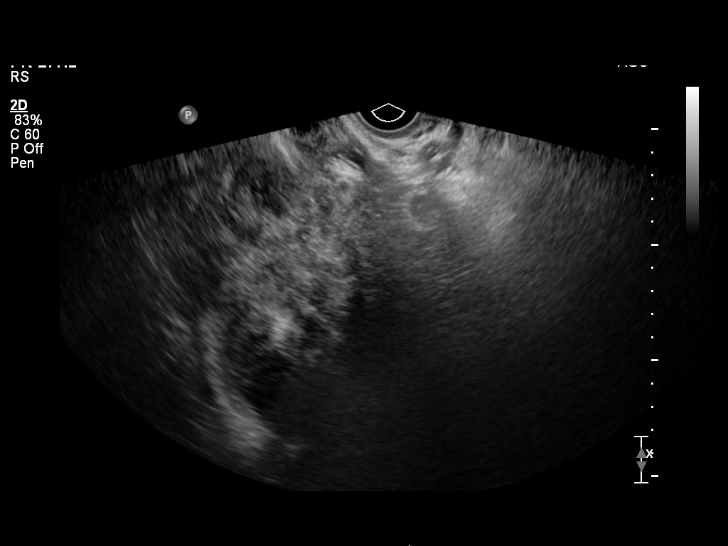
[im 41/50]
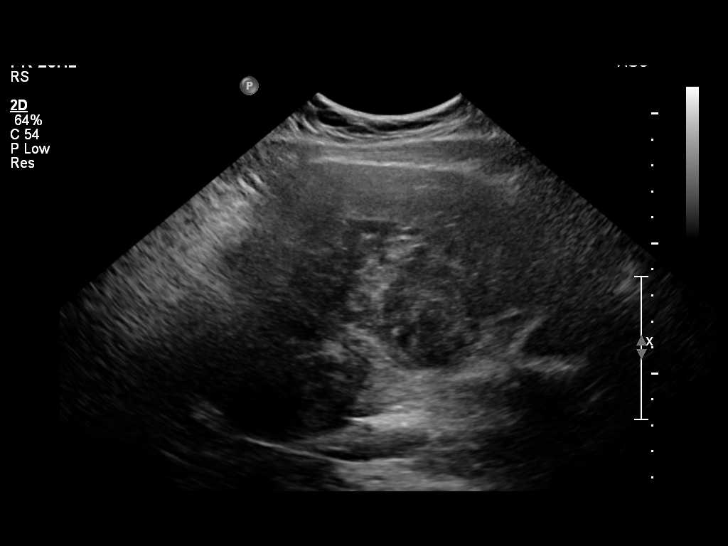
[im 45/50]
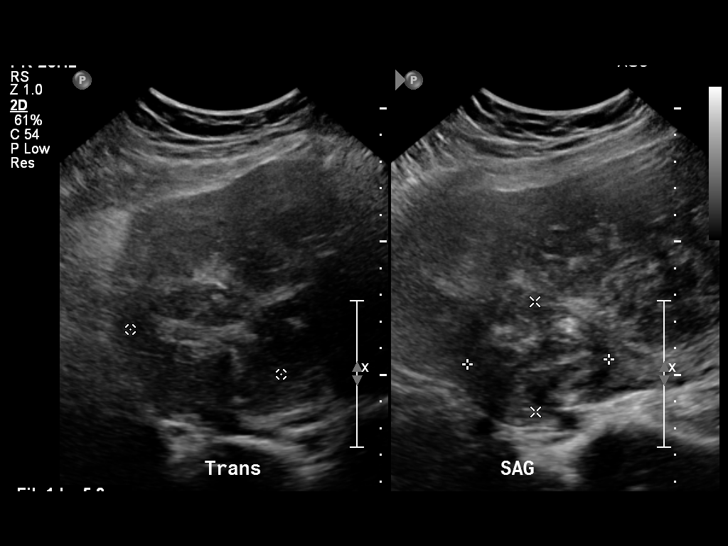
[im 50/50]
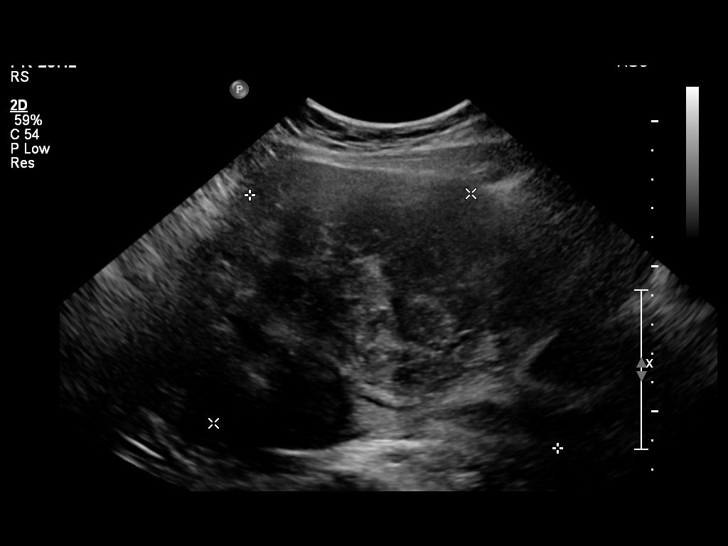

[13 of 25 positions shown; findings below may reference images not displayed]

FINDINGS: Uterus:  Markedly enlarged uterus, measuring least 14.6 x 14.6 x
12.8 cm.  Diffuse involvement by fibroids is seen, and individual
fibroids are difficult to define due to acoustic shadowing.  To the
largest measurable fibroids are seen in the anterior corpus
measuring 9.6 cm and in the posterior fundal region measuring
cm.

Endometrium: Obscured by acoustic shadowing from fibroids both
transabdominal and transvaginal sonography.

Right ovary: not directly visualized by transabdominal or
transvaginal sonography, however no adnexal mass identified.

Left ovary: not directly visualized by transabdominal or
transvaginal sonography, however no adnexal mass identified.

Other Findings:  No free fluid
IMPRESSION: 1.  Markedly enlarged myomatous uterus, as described above.
2.  Nonvisualization of ovaries, however no adnexal mass or free
fluid identified.

## 2015-03-28 ENCOUNTER — Other Ambulatory Visit (HOSPITAL_COMMUNITY)
Admission: RE | Admit: 2015-03-28 | Discharge: 2015-03-28 | Disposition: A | Discharge status: Home / Self Care | Payer: BC Managed Care – PPO | Source: Ambulatory Visit | Attending: Obstetrics and Gynecology | Admitting: Obstetrics and Gynecology

## 2015-03-28 ENCOUNTER — Other Ambulatory Visit: Payer: Self-pay | Admitting: Obstetrics and Gynecology

## 2015-03-28 DIAGNOSIS — Z01411 Encounter for gynecological examination (general) (routine) with abnormal findings: Secondary | ICD-10-CM | POA: Insufficient documentation

## 2015-03-29 LAB — CYTOLOGY - PAP

## 2015-11-03 ENCOUNTER — Other Ambulatory Visit: Payer: Self-pay | Admitting: Physician Assistant

## 2015-11-03 ENCOUNTER — Ambulatory Visit
Admission: RE | Admit: 2015-11-03 | Discharge: 2015-11-03 | Disposition: A | Discharge status: Home / Self Care | Payer: BC Managed Care – PPO | Source: Ambulatory Visit | Attending: Physician Assistant | Admitting: Physician Assistant

## 2015-11-03 DIAGNOSIS — M25571 Pain in right ankle and joints of right foot: Secondary | ICD-10-CM

## 2016-01-04 ENCOUNTER — Encounter: Payer: Self-pay | Admitting: Emergency Medicine

## 2016-01-04 ENCOUNTER — Emergency Department
Admission: EM | Admit: 2016-01-04 | Discharge: 2016-01-04 | Disposition: A | Discharge status: Home / Self Care | Payer: BC Managed Care – PPO | Attending: Emergency Medicine | Admitting: Emergency Medicine

## 2016-01-04 DIAGNOSIS — Z79899 Other long term (current) drug therapy: Secondary | ICD-10-CM | POA: Diagnosis not present

## 2016-01-04 DIAGNOSIS — J45909 Unspecified asthma, uncomplicated: Secondary | ICD-10-CM | POA: Diagnosis not present

## 2016-01-04 DIAGNOSIS — I10 Essential (primary) hypertension: Secondary | ICD-10-CM | POA: Insufficient documentation

## 2016-01-04 DIAGNOSIS — Z791 Long term (current) use of non-steroidal anti-inflammatories (NSAID): Secondary | ICD-10-CM | POA: Diagnosis not present

## 2016-01-04 DIAGNOSIS — N390 Urinary tract infection, site not specified: Secondary | ICD-10-CM | POA: Diagnosis not present

## 2016-01-04 DIAGNOSIS — F17290 Nicotine dependence, other tobacco product, uncomplicated: Secondary | ICD-10-CM | POA: Insufficient documentation

## 2016-01-04 DIAGNOSIS — E119 Type 2 diabetes mellitus without complications: Secondary | ICD-10-CM | POA: Diagnosis not present

## 2016-01-04 DIAGNOSIS — N898 Other specified noninflammatory disorders of vagina: Secondary | ICD-10-CM | POA: Diagnosis present

## 2016-01-04 DIAGNOSIS — F419 Anxiety disorder, unspecified: Secondary | ICD-10-CM

## 2016-01-04 LAB — URINALYSIS, ROUTINE W REFLEX MICROSCOPIC
Bilirubin Urine: NEGATIVE
Glucose, UA: NEGATIVE mg/dL
Hgb urine dipstick: NEGATIVE
Ketones, ur: NEGATIVE mg/dL
Nitrite: NEGATIVE
Protein, ur: NEGATIVE mg/dL
SPECIFIC GRAVITY, URINE: 1.006 (ref 1.005–1.030)
pH: 7 (ref 5.0–8.0)

## 2016-01-04 LAB — CHLAMYDIA/NGC RT PCR (ARMC ONLY)
CHLAMYDIA TR: NOT DETECTED
N GONORRHOEAE: NOT DETECTED

## 2016-01-04 LAB — WET PREP, GENITAL
Clue Cells Wet Prep HPF POC: NONE SEEN
Sperm: NONE SEEN
TRICH WET PREP: NONE SEEN
WBC, Wet Prep HPF POC: NONE SEEN
YEAST WET PREP: NONE SEEN

## 2016-01-04 LAB — POCT PREGNANCY, URINE: PREG TEST UR: NEGATIVE

## 2016-01-04 MED ORDER — PHENAZOPYRIDINE HCL 200 MG PO TABS: 200.0000 mg | ORAL_TABLET | Freq: Three times a day (TID) | ORAL | 0 refills | Status: DC | PRN

## 2016-01-04 MED ORDER — SULFAMETHOXAZOLE-TRIMETHOPRIM 800-160 MG PO TABS: 1.0000 | ORAL_TABLET | Freq: Two times a day (BID) | ORAL | 0 refills | Status: DC

## 2016-01-04 MED ORDER — FLUCONAZOLE 150 MG PO TABS: 150.0000 mg | ORAL_TABLET | Freq: Every day | ORAL | 0 refills | Status: DC

## 2016-01-04 MED ORDER — ONDANSETRON 4 MG PO TBDP
4.0000 mg | ORAL_TABLET | Freq: Once | ORAL | Status: AC
  Administered 2016-01-04: 4 mg via ORAL
  Filled 2016-01-04: qty 1

## 2016-01-04 MED ORDER — ALPRAZOLAM 0.5 MG PO TABS
1.0000 mg | ORAL_TABLET | Freq: Once | ORAL | Status: AC
  Administered 2016-01-04: 1 mg via ORAL
  Filled 2016-01-04: qty 2

## 2016-01-04 NOTE — ED Notes (Signed)
See triage note  States she has been having urinary freq for the past couple of days   But also has felt "funny" some nausea with chills  And headache  Hx of anxiety   Took vistaril last pm and felt better  Then this am sx's returned

## 2016-01-04 NOTE — ED Triage Notes (Signed)
Pt presents with anxiety started last night, took her meds but feels very anxious this am. Also reports vaginal discharge.

## 2016-01-04 NOTE — Discharge Instructions (Signed)
Patient advised to increase her water intake. Advised to continue previous medication follow-up family doctor if anxiety levels increase.

## 2016-01-04 NOTE — ED Provider Notes (Signed)
[facility] Emergency Department Provider Note    ____________________________________________   First MD Initiated Contact with Patient 01/04/16 0848     (approximate)  I have reviewed the triage vital signs and the nursing notes.   HISTORY  Chief Complaint Anxiety and Vaginal Discharge    HPI [place] is a 35 y.o. female patient complain of anxiety was started last night. Patient states she took Atarax last night had a transient relief but felt very anxious this morning. Patient took another Atarax prior to arrival this morning. Patient also states she's felt nausea and was sent chills. Complained of frontal headache. No palliative measures for these 2 complaints. Patient states she's had a vaginal discharge for 1 week. She describes the discharge as white and tinged blood with blood. Patient last menstrual. Patient state one week ago  used a vaginal suppository but do not know the name the medication. Patient also complaining of dysuria for 1 week. Past Medical History:  Diagnosis Date  . Acne   . Anemia   . Anxiety   . Asthma   . Depression   . Diabetes mellitus without complication (HCC)    history - MD took pt off oral meds, no current meds  . GERD (gastroesophageal reflux disease)   . Heartburn   . Hypertension    history of htn - MD took pt off meds, no current meds  . Panic attacks   . Seasonal allergies     Patient Active Problem List   Diagnosis Date Noted  . Fibroids 08/05/2012  . Menorrhagia 08/05/2012  . Anemia 08/05/2012  . High blood pressure 08/05/2012  . Asthma 08/05/2012  . Anxiety and depression 08/05/2012    Past Surgical History:  Procedure Laterality Date  . MYOMECTOMY N/A 08/04/2012   Procedure: MYOMECTOMY;  Surgeon: Maeola Sarah. Landry Mellow, MD;  Location: [facility];  Service: Gynecology;  Laterality: N/A;  . TOOTH EXTRACTION     local    Prior to Admission medications   Medication Sig Start Date End Date Taking?  Authorizing Provider  amLODipine (NORVASC) 10 MG tablet Take 10 mg by mouth daily.   Yes Historical Provider, MD  DULoxetine (CYMBALTA) 60 MG capsule Take 60 mg by mouth daily.   Yes Historical Provider, MD  hydrOXYzine (ATARAX/VISTARIL) 50 MG tablet Take 50 mg by mouth 3 (three) times daily as needed.   Yes Historical Provider, MD  lisinopril (PRINIVIL,ZESTRIL) 20 MG tablet Take 20 mg by mouth daily.   Yes Historical Provider, MD  adapalene (DIFFERIN) 0.1 % gel Apply 1 application topically daily as needed (for acne).    Historical Provider, MD  albuterol (PROVENTIL HFA;VENTOLIN HFA) 108 (90 BASE) MCG/ACT inhaler Inhale 2 puffs into the lungs every 6 (six) hours as needed (allergies).    Historical Provider, MD  ALPRAZolam Duanne Moron) 0.5 MG tablet Take 0.5 mg by mouth daily as needed for anxiety.    Historical Provider, MD  ARIPiprazole (ABILIFY) 2 MG tablet Take 2 mg by mouth daily.    Historical Provider, MD  Biotin 10 MG CAPS Take 10 mg by mouth daily.    Historical Provider, MD  calcium carbonate (TUMS - DOSED IN MG ELEMENTAL CALCIUM) 500 MG chewable tablet Chew 1 tablet by mouth daily as needed for heartburn.    Historical Provider, MD  cetirizine (ZYRTEC) 10 MG tablet Take 10 mg by mouth daily.    Historical Provider, MD  citalopram (CELEXA) 40 MG tablet Take 40 mg by mouth at bedtime.  Historical Provider, MD  desogestrel-ethinyl estradiol (APRI,EMOQUETTE,SOLIA) 0.15-30 MG-MCG tablet Take 1 tablet by mouth daily.    Historical Provider, MD  docusate sodium (COLACE) 100 MG capsule Take 200 mg by mouth daily.    Historical Provider, MD  estazolam (PROSOM) 2 MG tablet Take 4 mg by mouth at bedtime.     Historical Provider, MD  fluconazole (DIFLUCAN) 150 MG tablet Take 1 tablet (150 mg total) by mouth daily. 01/04/16   Sable Feil, PA-C  HYDROcodone-acetaminophen (NORCO/VICODIN) 5-325 MG per tablet Take 1-2 tablets by mouth every 6 (six) hours as needed. 08/07/12   Christophe Louis, MD  ibuprofen  (ADVIL,MOTRIN) 600 MG tablet Take 1 tablet (600 mg total) by mouth every 6 (six) hours as needed for pain. 08/07/12   Christophe Louis, MD  IRON PO Take 2 tablets by mouth daily.    Historical Provider, MD  minocycline (MINOCIN,DYNACIN) 100 MG capsule Take 100 mg by mouth 2 (two) times daily.    Historical Provider, MD  naphazoline-pheniramine (NAPHCON-A) 0.025-0.3 % ophthalmic solution Place 1 drop into both eyes daily as needed (for itchy eyes).    Historical Provider, MD  omeprazole (PRILOSEC) 20 MG capsule Take 20 mg by mouth daily.    Historical Provider, MD  oxymetazoline (AFRIN) 0.05 % nasal spray Place 2 sprays into the nose 2 (two) times a week.    Historical Provider, MD  phenazopyridine (PYRIDIUM) 200 MG tablet Take 1 tablet (200 mg total) by mouth 3 (three) times daily as needed for pain. 01/04/16   Sable Feil, PA-C  sulfamethoxazole-trimethoprim (BACTRIM DS,SEPTRA DS) 800-160 MG tablet Take 1 tablet by mouth 2 (two) times daily. 01/04/16   Sable Feil, PA-C    Allergies Codeine and Other  No family history on file.  Social History Social History  Substance Use Topics  . Smoking status: Current Every Day Smoker    Years: 4.00    Types: Cigars  . Smokeless tobacco: Never Used     Comment: Smokes cigars 1 per day  . Alcohol use Yes     Comment: socially    Review of Systems Constitutional: No fever/chills Eyes: No visual changes. ENT: No sore throat. Cardiovascular: Denies chest pain. Respiratory: Denies shortness of breath. Gastrointestinal: No abdominal pain.  No nausea, no vomiting.  No diarrhea.  No constipation. Genitourinary: Positive for dysuria and vaginal discharge Musculoskeletal: Negative for back pain. Skin: Negative for rash. Neurological: Negative for headaches, focal weakness or numbness. Psychiatric:Anxiety and depression Endocrine:Hypertension and diabetes Hematological/Lymphatic: Allergic/Immunilogical:  Codeine   ____________________________________________   PHYSICAL EXAM:  VITAL SIGNS: ED Triage Vitals  Enc Vitals Group     BP 01/04/16 0826 (!) 144/98     Pulse Rate 01/04/16 0826 (!) 108     Resp 01/04/16 0826 20     Temp 01/04/16 0826 98.2 F (36.8 C)     Temp Source 01/04/16 0826 Oral     SpO2 01/04/16 0826 100 %     Weight 01/04/16 0827 220 lb (99.8 kg)     Height 01/04/16 0827 5\' 3"  (1.6 m)     Head Circumference --      Peak Flow --      Pain Score --      Pain Loc --      Pain Edu? --      Excl. in [place]? --     Constitutional: Alert and oriented. Well appearing and in no acute distress. Chest Eyes: Conjunctivae are normal. PERRL. EOMI. Head:  Atraumatic. Nose: No congestion/rhinnorhea. Mouth/Throat: Mucous membranes are moist.  Oropharynx non-erythematous. Neck: No stridor.  No cervical spine tenderness to palpation. Hematological/Lymphatic/Immunilogical: No cervical lymphadenopathy. Cardiovascular: Normal rate, regular rhythm. Grossly normal heart sounds.  Good peripheral circulation. Respiratory: Normal respiratory effort.  No retractions. Lungs CTAB. Gastrointestinal: Soft and nontender. No distention. No abdominal bruits. No CVA tenderness. Genitourinary: Chaperoned exam revealed whitest discharge. No vaginal lesions. Musculoskeletal: No lower extremity tenderness nor edema.  No joint effusions. Neurologic:  Normal speech and language.  No gross focal neurologic deficits are appreciated. No gait instability. Skin:  Skin is warm, dry and intact. No rash noted. Psychiatric: Mood and affect are normal. Speech and behavior are normal.  ____________________________________________   LABS (all labs ordered are listed, but only abnormal results are displayed)  Labs Reviewed  URINALYSIS, ROUTINE W REFLEX MICROSCOPIC - Abnormal; Notable for the following:       Result Value   Color, Urine STRAW (*)    APPearance CLEAR (*)    Leukocytes, UA TRACE (*)     Bacteria, UA MANY (*)    Squamous Epithelial / LPF 0-5 (*)    All other components within normal limits  WET PREP, GENITAL  CHLAMYDIA/NGC RT PCR ([facility] ONLY)  POC URINE PREG, ED  POCT PREGNANCY, URINE   ____________________________________________  EKG   ____________________________________________  RADIOLOGY   ____________________________________________   PROCEDURES  Procedure(s) performed: None  Procedures  Critical Care performed: No  ____________________________________________   INITIAL IMPRESSION / ASSESSMENT AND PLAN / ED COURSE  Pertinent labs & imaging results that were available during my care of the patient were reviewed by me and considered in my medical decision making (see chart for details).  Urinary tract infection and anxiety. Patient given discharge care instructions. Patient given prescription for Bactrim, Pyridium, and Diflucan.  Clinical Course    Patient presents with urinary frequency and anxiety. Discuss lab results consistent urinary tract infection. Patient anxiety level has increased status post 4 mg Xanax. Patient advised to continue previous medication follow-up family doctor.  ____________________________________________   FINAL CLINICAL IMPRESSION(S) / ED DIAGNOSES  Final diagnoses:  Lower urinary tract infectious disease  Anxiety      NEW MEDICATIONS STARTED DURING THIS VISIT:  New Prescriptions   FLUCONAZOLE (DIFLUCAN) 150 MG TABLET    Take 1 tablet (150 mg total) by mouth daily.   PHENAZOPYRIDINE (PYRIDIUM) 200 MG TABLET    Take 1 tablet (200 mg total) by mouth 3 (three) times daily as needed for pain.   SULFAMETHOXAZOLE-TRIMETHOPRIM (BACTRIM DS,SEPTRA DS) 800-160 MG TABLET    Take 1 tablet by mouth 2 (two) times daily.     Note:  This document was prepared using Dragon voice recognition software and may include unintentional dictation errors.    Sable Feil, PA-C 01/04/16 1002    Nance Pear,  MD 01/04/16 [phone]

## 2017-02-25 ENCOUNTER — Other Ambulatory Visit (HOSPITAL_COMMUNITY)
Admission: RE | Admit: 2017-02-25 | Discharge: 2017-02-25 | Disposition: A | Discharge status: Home / Self Care | Payer: BC Managed Care – PPO | Source: Ambulatory Visit | Attending: Obstetrics and Gynecology | Admitting: Obstetrics and Gynecology

## 2017-02-25 ENCOUNTER — Other Ambulatory Visit: Payer: Self-pay | Admitting: Obstetrics and Gynecology

## 2017-02-25 DIAGNOSIS — Z124 Encounter for screening for malignant neoplasm of cervix: Secondary | ICD-10-CM | POA: Diagnosis present

## 2017-02-27 LAB — CYTOLOGY - PAP
Chlamydia: NEGATIVE
Diagnosis: HIGH — AB
HPV (WINDOPATH): DETECTED — AB
NEISSERIA GONORRHEA: NEGATIVE

## 2017-03-19 ENCOUNTER — Other Ambulatory Visit: Payer: Self-pay | Admitting: Obstetrics and Gynecology

## 2017-04-23 ENCOUNTER — Other Ambulatory Visit: Payer: Self-pay | Admitting: Obstetrics and Gynecology

## 2017-09-19 IMAGING — CR DG ANKLE COMPLETE 3+V*R*
3 series · 3 of 3 positions shown · non-contrast
Comparison: None.

CLINICAL DATA: Stepped on object going down stairs and inversion
rolled RIGHT ankle 1 month ago / c/o pain since and increasing /
pain MED MAL area radiating to heel and lat MAL / weight bearing or
non weight bearing now painful .

EXAM:
RIGHT ANKLE - COMPLETE 3+ VIEW

[x ankle ap right]
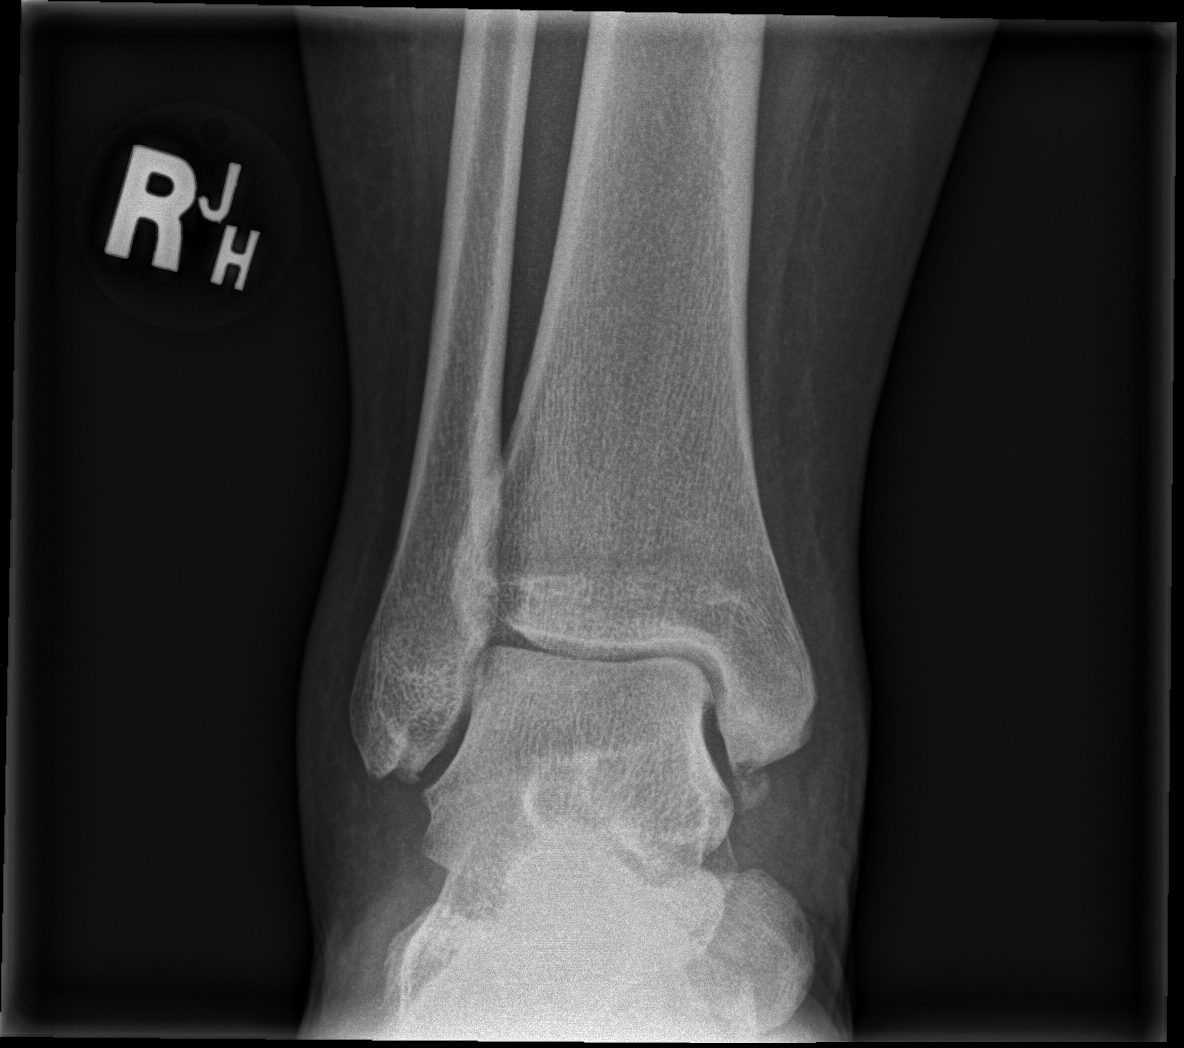

[x ankle obl right]
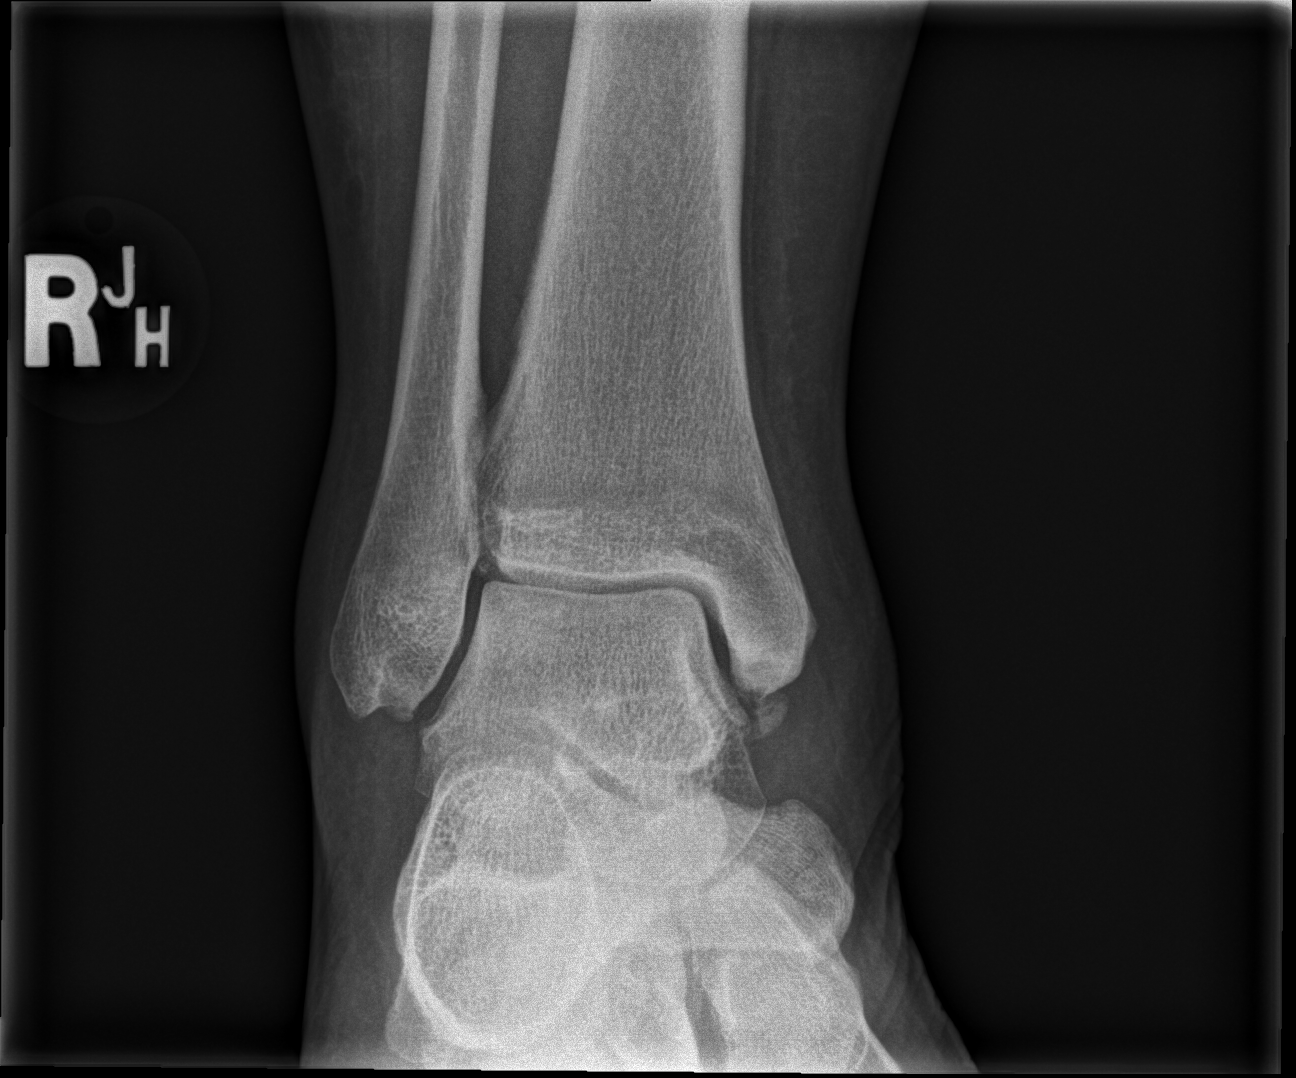

[x ankle lat right]
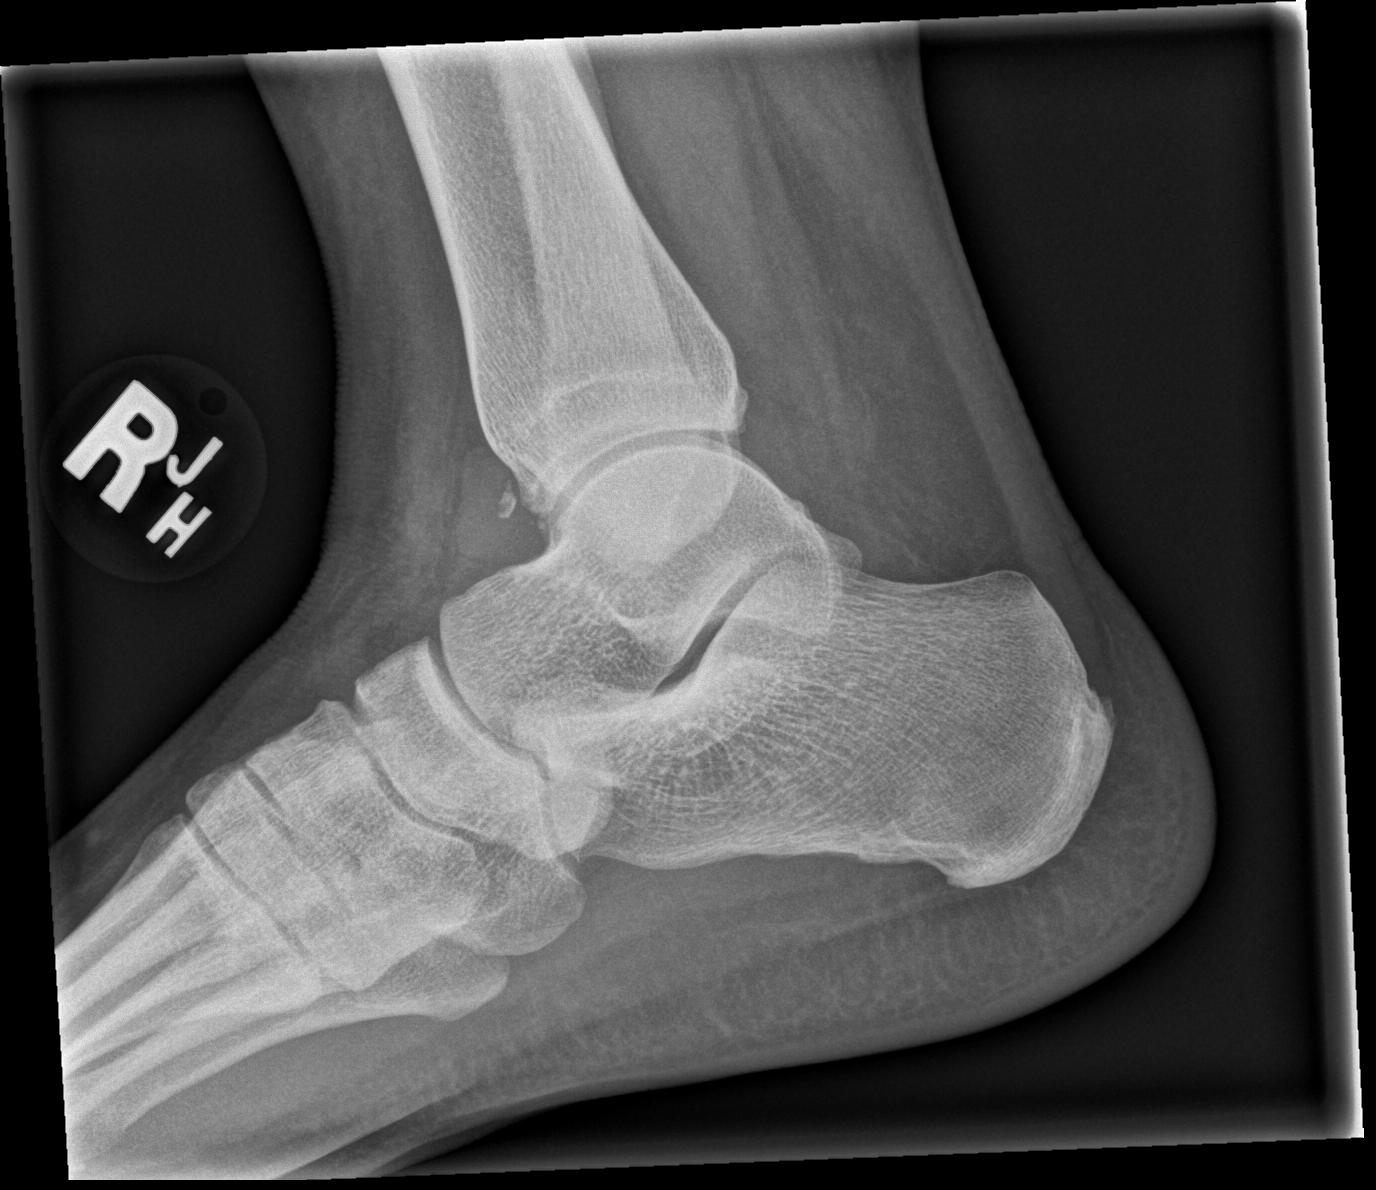

[3 of 3 positions shown; findings below may reference images not displayed]

FINDINGS: There is irregularity of the medial malleolus consistent with
subacute injury. The mortise is intact. Small bone fragment is
identified along the anterior aspect of the tibia consistent with
small avulsion injury. The posterior malleolus is intact. There is
lateral soft tissue swelling.
IMPRESSION: 1. Irregularity at the medial malleolus favoring subacute injury.
2. Small anterior tibial avulsion.

## 2018-04-01 ENCOUNTER — Other Ambulatory Visit (HOSPITAL_COMMUNITY)
Admission: RE | Admit: 2018-04-01 | Discharge: 2018-04-01 | Disposition: A | Discharge status: Home / Self Care | Payer: BC Managed Care – PPO | Source: Ambulatory Visit | Attending: Obstetrics and Gynecology | Admitting: Obstetrics and Gynecology

## 2018-04-01 ENCOUNTER — Other Ambulatory Visit: Payer: Self-pay | Admitting: Obstetrics and Gynecology

## 2018-04-01 DIAGNOSIS — Z01419 Encounter for gynecological examination (general) (routine) without abnormal findings: Secondary | ICD-10-CM | POA: Diagnosis not present

## 2018-04-03 LAB — CYTOLOGY - PAP: HPV: DETECTED — AB

## 2018-05-04 ENCOUNTER — Other Ambulatory Visit: Payer: Self-pay | Admitting: Obstetrics and Gynecology

## 2019-08-10 ENCOUNTER — Other Ambulatory Visit: Payer: Self-pay | Admitting: Obstetrics and Gynecology

## 2019-12-06 NOTE — Progress Notes (Signed)
Please enter orders for PAT visit [place] for 12-07-19.

## 2019-12-06 NOTE — Patient Instructions (Addendum)
DUE TO COVID-19 ONLY ONE VISITOR IS ALLOWED IN WAITING ROOM (VISITOR WILL HAVE A TEMPERATURE CHECK ON ARRIVAL AND MUST WEAR A FACE MASK THE ENTIRE TIME.)  ONCE YOU ARE ADMITTED TO YOUR PRIVATE ROOM, THE SAME ONE VISITOR IS ALLOWED TO VISIT DURING VISITING HOURS ONLY.  Your COVID swab testing is scheduled for Saturday, 12-11-19 at 10:30 AM,   You must self quarantine after your testing per handout given to you at the testing site. [address] [place] [address] [place], [place] [address]  (Must self quarantine after testing. Follow instructions on handout.)       Your procedure is scheduled on:  Wednesday, 12-15-19  Report to [facility] AT 9:40  A. M.   Call this number if you have problems the morning of [phone].   OUR ADDRESS IS [place].  WE ARE LOCATED IN [facility].                                     REMEMBER:  DO NOT EAT FOOD AFTER MIDNIGHT .    YOU MAY HAVE CLEAR LIQUIDS FROM MIDNIGHT UNTIL 8:30 AM THE DAY OF SURGERY . CLEAR LIQUID DIET  Foods Allowed                                                                     Foods Excluded  Water, Black Coffee and tea, regular and decaf              liquids that you cannot  Plain Jell-O in any flavor  (No red)                                    see through such as: Fruit ices (not with fruit pulp)                                      milk, soups, orange juice  Iced Popsicles (No red)                                     All solid food                                   Apple juices Sports drinks like Gatorade (No red) Lightly seasoned clear broth or consume(fat free) Sugar, honey syrup   BRUSH YOUR TEETH THE MORNING OF SURGERY.  TAKE THESE MEDICATIONS MORNING OF SURGERY WITH A SIP OF WATER:  Amlodipine, Zyrtec, Duloxetine, Wellbutrin, Seroquel  DO NOT WEAR JEWERLY, MAKE UP, OR NAIL POLISH.  DO NOT WEAR LOTIONS, POWDERS, PERFUMES/COLOGNE OR DEODORANT.  DO NOT SHAVE FOR 24 HOURS PRIOR TO  DAY OF SURGERY.  CONTACTS, GLASSES, OR DENTURES MAY NOT BE WORN TO SURGERY.  [facility] IS NOT RESPONSIBLE  FOR ANY BELONGINGS.                                                                     WHAT IS A BLOOD TRANSFUSION? Blood Transfusion Information  A transfusion is the replacement of blood or some of its parts. Blood is made up of multiple cells which provide different functions.  Red blood cells carry oxygen and are used for blood loss replacement.  White blood cells fight against infection.  Platelets control bleeding.  Plasma helps clot blood.  Other blood products are available for specialized needs, such as hemophilia or other clotting disorders. BEFORE THE TRANSFUSION  Who gives blood for transfusions?   Healthy volunteers who are fully evaluated to make sure their blood is safe. This is blood bank blood. Transfusion therapy is the safest it has ever been in the practice of medicine. Before blood is taken from a donor, a complete history is taken to make sure that person has no history of diseases nor engages in risky social behavior (examples are intravenous drug use or sexual activity with multiple partners). The donor's travel history is screened to minimize risk of transmitting infections, such as malaria. The donated blood is tested for signs of infectious diseases, such as HIV and hepatitis. The blood is then tested to be sure it is compatible with you in order to minimize the chance of a transfusion reaction. If you or a relative donates blood, this is often done in anticipation of surgery and is not appropriate for emergency situations. It takes many days to process the donated blood. RISKS AND COMPLICATIONS Although transfusion therapy is very safe and saves many lives, the main dangers of transfusion include:   Getting an infectious disease.  Developing a transfusion reaction. This is an allergic reaction to something in the  blood you were given. Every precaution is taken to prevent this. The decision to have a blood transfusion has been considered carefully by your caregiver before blood is given. Blood is not given unless the benefits outweigh the risks. AFTER THE TRANSFUSION  Right after receiving a blood transfusion, you will usually feel much better and more energetic. This is especially true if your red blood cells have gotten low (anemic). The transfusion raises the level of the red blood cells which carry oxygen, and this usually causes an energy increase.  The nurse administering the transfusion will monitor you carefully for complications. HOME CARE INSTRUCTIONS  No special instructions are needed after a transfusion. You may find your energy is better. Speak with your caregiver about any limitations on activity for underlying diseases you may have. SEEK MEDICAL CARE IF:   Your condition is not improving after your transfusion.  You develop redness or irritation at the intravenous (IV) site. SEEK IMMEDIATE MEDICAL CARE IF:  Any of the following symptoms occur over the next 12 hours:  Shaking chills.  You have a temperature by mouth above 102 F (38.9 C), not controlled by medicine.  Chest, back, or muscle pain.  People around you feel you are not acting correctly or are confused.  Shortness of breath or difficulty breathing.  Dizziness and fainting.  You get a rash or develop hives.  You have a  decrease in urine output.  Your urine turns a dark color or changes to pink, red, or brown. Any of the following symptoms occur over the next 10 days:  You have a temperature by mouth above 102 F (38.9 C), not controlled by medicine.  Shortness of breath.  Weakness after normal activity.  The white part of the eye turns yellow (jaundice).  You have a decrease in the amount of urine or are urinating less often.  Your urine turns a dark color or changes to pink, red, or brown. Document  Released: 12/22/1999 Document Revised: 03/18/2011 Document Reviewed: 08/10/2007 [facility] Patient Information 2014 [place], [place].  _______________________________________________________________________

## 2019-12-06 NOTE — Progress Notes (Addendum)
COVID Vaccine Completed: x2 Date COVID Vaccine completed:  03-04-19 & 03-26-19 COVID vaccine manufacturer: [place]   PCP - Maude Leriche, PA-C Cardiologist -   Chest x-ray -  EKG - 12-07-19 in Epic Stress Test -  ECHO -  Cardiac Cath -  Pacemaker/ICD device last checked:  Sleep Study -  CPAP -   Pt has history of diabetes, but no longer on meds and does not check sugars at home.  Labs returned to normal after weight loss.   Fasting Blood Sugar -  Checks Blood Sugar _____ times a day  Blood Thinner Instructions: Aspirin Instructions: Last Dose:  Anesthesia review: Poor R wave progression on EKG  Patient denies shortness of breath, fever, cough and chest pain at PAT appointment.  Patient able to climb a flight of stairs and perform ADL's without assistance.   Patient verbalized understanding of instructions that were given to them at the PAT appointment. Patient was also instructed that they will need to review over the PAT instructions again at home before surgery.

## 2019-12-07 ENCOUNTER — Encounter (HOSPITAL_COMMUNITY)
Admission: RE | Admit: 2019-12-07 | Discharge: 2019-12-07 | Disposition: A | Discharge status: Home / Self Care | Payer: BC Managed Care – PPO | Source: Ambulatory Visit | Attending: Obstetrics and Gynecology | Admitting: Obstetrics and Gynecology

## 2019-12-07 ENCOUNTER — Other Ambulatory Visit: Payer: Self-pay

## 2019-12-07 ENCOUNTER — Encounter (HOSPITAL_COMMUNITY): Payer: Self-pay

## 2019-12-07 DIAGNOSIS — Z01818 Encounter for other preprocedural examination: Secondary | ICD-10-CM | POA: Diagnosis not present

## 2019-12-07 DIAGNOSIS — I10 Essential (primary) hypertension: Secondary | ICD-10-CM | POA: Insufficient documentation

## 2019-12-07 LAB — CBC
HCT: 38.8 % (ref 36.0–46.0)
Hemoglobin: 11.5 g/dL — ABNORMAL LOW (ref 12.0–15.0)
MCH: 21.9 pg — ABNORMAL LOW (ref 26.0–34.0)
MCHC: 29.6 g/dL — ABNORMAL LOW (ref 30.0–36.0)
MCV: 73.9 fL — ABNORMAL LOW (ref 80.0–100.0)
Platelets: 336 10*3/uL (ref 150–400)
RBC: 5.25 MIL/uL — ABNORMAL HIGH (ref 3.87–5.11)
RDW: 18.6 % — ABNORMAL HIGH (ref 11.5–15.5)
WBC: 7.1 10*3/uL (ref 4.0–10.5)
nRBC: 0 % (ref 0.0–0.2)

## 2019-12-07 LAB — BASIC METABOLIC PANEL
Anion gap: 9 (ref 5–15)
BUN: 21 mg/dL — ABNORMAL HIGH (ref 6–20)
CO2: 27 mmol/L (ref 22–32)
Calcium: 9.4 mg/dL (ref 8.9–10.3)
Chloride: 99 mmol/L (ref 98–111)
Creatinine, Ser: 1.39 mg/dL — ABNORMAL HIGH (ref 0.44–1.00)
GFR, Estimated: 49 mL/min — ABNORMAL LOW (ref >60)
Glucose, Bld: 91 mg/dL (ref 70–99)
Potassium: 3.5 mmol/L (ref 3.5–5.1)
Sodium: 135 mmol/L (ref 135–145)

## 2019-12-07 LAB — HEMOGLOBIN A1C
Hgb A1c MFr Bld: 5.4 % (ref 4.8–5.6)
Mean Plasma Glucose: 108.28 mg/dL

## 2019-12-07 NOTE — Progress Notes (Signed)
Spoke to lab regarding FYI in pt chart stating that she refuses blood.  Per lab have patient sign a blood consent or blood refusal day of surgery.

## 2019-12-11 ENCOUNTER — Other Ambulatory Visit (HOSPITAL_COMMUNITY)
Admission: RE | Admit: 2019-12-11 | Discharge: 2019-12-11 | Disposition: A | Discharge status: Home / Self Care | Payer: BC Managed Care – PPO | Source: Ambulatory Visit | Attending: Obstetrics and Gynecology | Admitting: Obstetrics and Gynecology

## 2019-12-11 DIAGNOSIS — Z20822 Contact with and (suspected) exposure to covid-19: Secondary | ICD-10-CM | POA: Insufficient documentation

## 2019-12-11 DIAGNOSIS — Z01812 Encounter for preprocedural laboratory examination: Secondary | ICD-10-CM | POA: Insufficient documentation

## 2019-12-12 ENCOUNTER — Other Ambulatory Visit: Payer: Self-pay | Admitting: Obstetrics and Gynecology

## 2019-12-12 LAB — SARS CORONAVIRUS 2 (TAT 6-24 HRS): SARS Coronavirus 2: NEGATIVE

## 2019-12-12 NOTE — H&P (Signed)
Reason for Appointment   1. 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 Does patient report new onset of COVID symptoms? No. Has patient or close contact tested positive for COVID-19? No , not in the past 2 weeks.  General:          39 y/o presents for pre op visit        She is scheduled for a robotic assisted laparascopic hysterectomy w/ bilateral salpingectomy for management of cervical dysplasia and fibroids on 12/15/2019.         Pt had a HGSIL pap smear which was negative for gc/chlamydia and detected high risk HPV on Feb 25, 2017. Colposcopy/ECC performed Mar 19, 2017 revealed koilocytic atypic consistent with HPV effect (CIN I) at 10 o'clock position. No atypia, dysplasia, or malignancy identified. LEEP performed Apr 23, 2017 revealed no atypia, dysplasia, or malignancy identified.        Pt had an ASCUS pap smear which detected high risk HPV on Apr 01, 2018. Colposcopy/ECC performed May 04, 2018 revealed LGSIL, CIN I (mild dysplasia). No evidence of malignancy.        Pt had a HGSIL pap smear which detected high risk HPV on July 14, 2019. Colposcopy performed Aug 10, 2019 revealed LGSIL, CIN I (mild dysplasia) at 6 o'clock position and benign epithelium at 12 o'clock. ECC revealed HGSIL (CIN II-III, moderate to severe dysplasia).        She had an u/s on 07/28/2019 which revealed at that time a uterus measruing 14.8 x 12.3 x 11.2cm.        Pt. reports experiencing some "weird" pain going across her lower abdomen. She states it was so severe she had to double over to deal with the pain.        Upon pelvic examination 12 week sized uterus, small amount of white discharge in vaginal vault.    Current Medications  Taking  .Spironolactone 25 MG Tablet 1 tablet Orally     .amLODIPine Besylate 10 MG Tablet 1 tablet Orally Once a day    .Omeprazole 20 MG Capsule Delayed  Release 1 capsule 30 minutes before morning meal Orally Once a day    .Lisinopril 10 MG Tablet 1 tablet Orally Once a day    .Chlorthalidone 50 MG Tablet 1 tablet in the morning Orally Once a day    .Cetirizine HCl 10 MG Tablet 1 tablet Orally Once a day, Notes: prn    .Cymbalta(DULoxetine HCl) 60 MG Capsule Delayed Release Particles 1 capsule Orally Once a day    .Wellbutrin SR(buPROPion HCl) 150 MG Tablet Extended Release 12 Hour 1 tablet Orally Twice a day    .Doxepin HCl 10 MG Capsule 1 capsule at bedtime Orally Once a day    .QUEtiapine Fumarate . Tablet 1 tablet at bedtime Orally Once a day   Not-Taking  .Clindamycin 300 mg 7d 300 mg tablets one tab orally twice a day   Discontinued  .Diflucan(Fluconazole) 150 MG Tablet 1 tablet Orally once a day    Medication List reviewed and reconciled with the patient         Past Medical History        Hypertension.         Asthma.         polycystic ovary syndrome - Dr. Landry Mellow.  Anxiety/ panic attacks.         GERD.         depression Dr. Rufina Falco at [facility] .          Acne - Dr. Renard Hamper, [facility] dermatology.         fibroids - [facility], Dr. Georgina Snell.         Left lateral meniscus tear.        Surgical History         abdominal myomectomy 08/04/12         LEEP 04/23/2017         fibroid embolization 07/2017         wisdom teeth        Family History   Father: alive, diagnosed with Diabetes, Hypertension, CVA, Prostate CA   Mother: alive, MI, diagnosed with Diabetes, Hypertension   Maternal Grand Father: deceased   Maternal Grand Mother: deceased, breast ca, liver ca, spinal ca, diagnosed with Hypertension, Breast cancer   Paternal [place] Mother: diagnosed with Hypertension   1 brother(s) , 1 sister(s) .    Paternal Aunt and Maternal Aunt Breast Cancer.       Social History  General:   Tobacco use  cigarettes: Former smoker, Quit in year 09/2018, Tobacco history  last updated 11/29/2019, Vaping No. EXPOSURE TO PASSIVE SMOKE: Stopped 07/28/12. Alcohol: yes, occasional. Caffeine: yes, coffee, soda, tea. Recreational drug use: no. Exercise: nothing structured. Children: none. 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 Religion: Christian. Seat belt use: yes.      Gyn History  Sexual activity not currently sexually active.   Periods : regular.   LMP 11/23/2019.   Birth control condoms.   Last pap smear date 07/14/2019-HGSIL.   Denies H/O Last mammogram date N/A.   H/O Abnormal pap smear assessed with colposcopy.   STD HPV.   Menarche 72.   H/O GYN procedures LEEP 04/23/17, Colposcopy 2019 & 2020.       OB History  Never been pregnant per patient.       Allergies   Raw Fruits & vegetables: Throat itch   Dilaudid: hives       Hospitalization/Major Diagnostic Procedure   abdominal myomectomy 08/04/12       Review of Systems  CONSTITUTIONAL:         Chills No. Fatigue No. Fever No. Night sweats No. Recent travel outside [place] No. Sweats No. Weight change No.     OPHTHALMOLOGY:         Blurring of vision no. Change in vision no. Double vision no.     ENT:         Dizziness no. Nose bleeds no. Sore throat no. Teeth pain no.     ALLERGY:         Hives no.     CARDIOLOGY:         Chest pain no. High blood pressure no. Irregular heart beat no. Leg edema no. Palpitations no.     RESPIRATORY:         Shortness of breath no. Cough no. Wheezing no.     UROLOGY:         Pain with urination no. Urinary urgency no. Urinary frequency no. Urinary incontinence no. Difficulty urinating No. Blood in urine No.     GASTROENTEROLOGY:         Abdominal pain no. Appetite change no. Bloating/belching no. Blood in stool or on toilet paper no. Change in bowel  movements no. Constipation no. Diarrhea no. Difficulty swallowing no. Nausea no.     FEMALE REPRODUCTIVE:         Vulvar pain no. Vulvar rash no. Abnormal vaginal bleeding no.  Breast pain no. Nipple discharge no. Pain with intercourse no. Pelvic pain no. Unusual vaginal discharge no. Vaginal itching no.     MUSCULOSKELETAL:         Muscle aches no.     NEUROLOGY:         Headache no. Tingling/numbness no. Weakness no.     PSYCHOLOGY:         Depression no. Anxiety no. Nervousness no. Sleep disturbances no. Suicidal ideation no .     ENDOCRINOLOGY:         Excessive thirst no. Excessive urination no. Hair loss no. Heat or cold intolerance no.     HEMATOLOGY/LYMPH:         Abnormal bleeding no. Easy bruising no. Swollen glands no.     DERMATOLOGY:         New/changing skin lesion no. Rash no. Sores no.     Negative except as stated in HPI.        Vital Signs  Wt 231.8, Wt change -7.3 lb, Ht 64.75, BMI 38.87, Pulse sitting 102, BP sitting 140/96.     Examination  General Examination:       CONSTITUTIONAL: alert, oriented, NAD. SKIN:  moist, warm. EYES:  Conjunctiva clear. LUNGS: clear to auscultation bilaterally. HEART: heart sounds are normal, rhythm is regular, no murmur. ABDOMEN: soft, non-tender/non-distended, bowel sounds present. FEMALE GENITOURINARY: normal external genitalia, labia - unremarkable, vagina - pink moist mucosa, no lesions, small amount of white discharge in vaginal vault, cervix - no discharge or lesions or CMT, adnexa - no masses or tenderness, uterus - nontender, 14 week sized uterus, on palpation. PSYCH:  affect normal, good eye contact.       Assessments     1. 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 Fibroids - D25.9   3. Vaginal discharge - N89.8     Treatment   1. CIN III (cervical intraepithelial neoplasia III)   Notes: She is scheduled for a robotic assisted laparascopic hysterectomy w/ bilateral salpingectomy on 12/15/2019. Discussed with pt. there is a risk of infection, bleeding, damage to bowel bladder and ureters with the need for further surgery. Also discussed with pt. the risks  associated with blood tranfusion. Given risks pt. wishes to receive blood products if warranted. Discussed possibility of converting to open procedure. She is advised she will stay one night in the hospital. In order to go home she has to tolerate pain meds by mouth, able to urinate, and be able to walk around. She is advised no driving for 1 week. She is also advised no heavy lifting of over 10lbs or sexual intercourse for 6-8 weeks following surgery. Pt. advised no eating or drinking night prior to surgery. She is advised to avoid aspirin, aleve, ibuprofen for 2 weeks prior to surgery. Plan to see in 4 weeks for 2 weeks post op visit.      2. Fibroids   Notes: She is scheduled for a robotic assisted laparascopic hysterectomy w/ bilateral salpingectomy on 12/15/2019.      3. Vaginal discharge        LAB: [facility] [place] Notes: Plan to call pt. with results and any further care if warranted.

## 2019-12-12 NOTE — H&P (Deleted)
  The note originally documented on this encounter has been moved the the encounter in which it belongs.  

## 2019-12-14 NOTE — Progress Notes (Signed)
Alexandria Cole, Scheduler for Dr. Landry Mellow, notified that consent order has abbreviations that need to be spelled out.  She will address.

## 2019-12-15 ENCOUNTER — Ambulatory Visit (HOSPITAL_BASED_OUTPATIENT_CLINIC_OR_DEPARTMENT_OTHER): Payer: BC Managed Care – PPO | Admitting: Physician Assistant

## 2019-12-15 ENCOUNTER — Ambulatory Visit (HOSPITAL_BASED_OUTPATIENT_CLINIC_OR_DEPARTMENT_OTHER): Payer: BC Managed Care – PPO | Admitting: Certified Registered"

## 2019-12-15 ENCOUNTER — Encounter (HOSPITAL_BASED_OUTPATIENT_CLINIC_OR_DEPARTMENT_OTHER): Payer: Self-pay | Admitting: Obstetrics and Gynecology

## 2019-12-15 ENCOUNTER — Encounter (HOSPITAL_BASED_OUTPATIENT_CLINIC_OR_DEPARTMENT_OTHER)
Admission: RE | Disposition: A | Discharge status: Home / Self Care | Payer: Self-pay | Source: Home / Self Care | Attending: Obstetrics and Gynecology

## 2019-12-15 ENCOUNTER — Observation Stay (HOSPITAL_BASED_OUTPATIENT_CLINIC_OR_DEPARTMENT_OTHER)
Admission: RE | Admit: 2019-12-15 | Discharge: 2019-12-16 | Disposition: A | Discharge status: Home / Self Care | Payer: BC Managed Care – PPO | Attending: Obstetrics and Gynecology | Admitting: Obstetrics and Gynecology

## 2019-12-15 ENCOUNTER — Other Ambulatory Visit: Payer: Self-pay

## 2019-12-15 DIAGNOSIS — Z79899 Other long term (current) drug therapy: Secondary | ICD-10-CM | POA: Insufficient documentation

## 2019-12-15 DIAGNOSIS — D259 Leiomyoma of uterus, unspecified: Principal | ICD-10-CM | POA: Insufficient documentation

## 2019-12-15 DIAGNOSIS — I10 Essential (primary) hypertension: Secondary | ICD-10-CM | POA: Diagnosis not present

## 2019-12-15 DIAGNOSIS — Z87891 Personal history of nicotine dependence: Secondary | ICD-10-CM | POA: Diagnosis not present

## 2019-12-15 DIAGNOSIS — Z90710 Acquired absence of both cervix and uterus: Secondary | ICD-10-CM | POA: Diagnosis present

## 2019-12-15 DIAGNOSIS — J45909 Unspecified asthma, uncomplicated: Secondary | ICD-10-CM | POA: Diagnosis not present

## 2019-12-15 HISTORY — PX: ROBOTIC ASSISTED LAPAROSCOPIC HYSTERECTOMY AND SALPINGECTOMY: SHX6379

## 2019-12-15 LAB — GLUCOSE, CAPILLARY
Glucose-Capillary: 94 mg/dL (ref 70–99)
Glucose-Capillary: 145 mg/dL — ABNORMAL HIGH (ref 70–99)

## 2019-12-15 LAB — TYPE AND SCREEN
ABO/RH(D): B POS
Antibody Screen: NEGATIVE

## 2019-12-15 LAB — POCT PREGNANCY, URINE: Preg Test, Ur: NEGATIVE

## 2019-12-15 SURGERY — XI ROBOTIC ASSISTED LAPAROSCOPIC HYSTERECTOMY AND SALPINGECTOMY
Anesthesia: General | Laterality: Bilateral

## 2019-12-15 MED ORDER — AMLODIPINE BESYLATE 10 MG PO TABS
10.0000 mg | ORAL_TABLET | Freq: Every day | ORAL | Status: DC
  Filled 2019-12-15: qty 1

## 2019-12-15 MED ORDER — ONDANSETRON HCL 4 MG/2ML IJ SOLN
INTRAMUSCULAR | Status: AC
  Filled 2019-12-15: qty 2

## 2019-12-15 MED ORDER — MENTHOL 3 MG MT LOZG: 1.0000 | LOZENGE | OROMUCOSAL | Status: DC | PRN

## 2019-12-15 MED ORDER — ORAL CARE MOUTH RINSE: 15.0000 mL | Freq: Once | OROMUCOSAL | Status: DC

## 2019-12-15 MED ORDER — IBUPROFEN 800 MG PO TABS
ORAL_TABLET | ORAL | Status: AC
  Filled 2019-12-15: qty 1

## 2019-12-15 MED ORDER — BUPROPION HCL ER (XL) 300 MG PO TB24
300.0000 mg | ORAL_TABLET | Freq: Every morning | ORAL | Status: DC
  Filled 2019-12-15: qty 1

## 2019-12-15 MED ORDER — FENTANYL CITRATE (PF) 100 MCG/2ML IJ SOLN
INTRAMUSCULAR | Status: DC | PRN
  Administered 2019-12-15: 100 ug via INTRAVENOUS
  Administered 2019-12-15 (×2): 50 ug via INTRAVENOUS

## 2019-12-15 MED ORDER — LIDOCAINE 2% (20 MG/ML) 5 ML SYRINGE
INTRAMUSCULAR | Status: DC | PRN
  Administered 2019-12-15: 60 mg via INTRAVENOUS

## 2019-12-15 MED ORDER — ACETAMINOPHEN 325 MG PO TABS: 650.0000 mg | ORAL_TABLET | ORAL | Status: DC | PRN

## 2019-12-15 MED ORDER — LACTATED RINGERS IV SOLN: INTRAVENOUS | Status: DC

## 2019-12-15 MED ORDER — ALBUTEROL SULFATE HFA 108 (90 BASE) MCG/ACT IN AERS: 2.0000 | INHALATION_SPRAY | Freq: Four times a day (QID) | RESPIRATORY_TRACT | Status: DC | PRN

## 2019-12-15 MED ORDER — CHLORHEXIDINE GLUCONATE 0.12 % MT SOLN: 15.0000 mL | Freq: Once | OROMUCOSAL | Status: DC

## 2019-12-15 MED ORDER — OXYCODONE HCL 5 MG PO TABS
5.0000 mg | ORAL_TABLET | ORAL | 0 refills | Status: AC | PRN
Start: 2019-12-15 — End: 2019-12-22

## 2019-12-15 MED ORDER — GABAPENTIN 300 MG PO CAPS
ORAL_CAPSULE | ORAL | Status: AC
  Filled 2019-12-15: qty 1

## 2019-12-15 MED ORDER — SIMETHICONE 80 MG PO CHEW
CHEWABLE_TABLET | ORAL | Status: AC
  Filled 2019-12-15: qty 1

## 2019-12-15 MED ORDER — ONDANSETRON HCL 4 MG/2ML IJ SOLN: 4.0000 mg | Freq: Four times a day (QID) | INTRAMUSCULAR | Status: DC | PRN

## 2019-12-15 MED ORDER — IBUPROFEN 800 MG PO TABS
800.0000 mg | ORAL_TABLET | Freq: Three times a day (TID) | ORAL | Status: DC
  Administered 2019-12-15 – 2019-12-16 (×2): 800 mg via ORAL

## 2019-12-15 MED ORDER — CHLORTHALIDONE 50 MG PO TABS
50.0000 mg | ORAL_TABLET | Freq: Every day | ORAL | Status: DC
  Filled 2019-12-15: qty 1

## 2019-12-15 MED ORDER — QUETIAPINE FUMARATE 25 MG PO TABS
25.0000 mg | ORAL_TABLET | Freq: Every day | ORAL | Status: DC
  Filled 2019-12-15: qty 1

## 2019-12-15 MED ORDER — AMISULPRIDE (ANTIEMETIC) 5 MG/2ML IV SOLN: 10.0000 mg | Freq: Once | INTRAVENOUS | Status: DC | PRN

## 2019-12-15 MED ORDER — CELECOXIB 200 MG PO CAPS
400.0000 mg | ORAL_CAPSULE | ORAL | Status: AC
  Administered 2019-12-15: 400 mg via ORAL

## 2019-12-15 MED ORDER — MIDAZOLAM HCL 2 MG/2ML IJ SOLN
INTRAMUSCULAR | Status: AC
  Filled 2019-12-15: qty 2

## 2019-12-15 MED ORDER — OXYCODONE HCL 5 MG/5ML PO SOLN: 5.0000 mg | Freq: Once | ORAL | Status: DC | PRN

## 2019-12-15 MED ORDER — OXYCODONE HCL 5 MG PO TABS: 5.0000 mg | ORAL_TABLET | Freq: Once | ORAL | Status: DC | PRN

## 2019-12-15 MED ORDER — FENTANYL CITRATE (PF) 250 MCG/5ML IJ SOLN
INTRAMUSCULAR | Status: AC
  Filled 2019-12-15: qty 5

## 2019-12-15 MED ORDER — SODIUM CHLORIDE 0.9 % IV SOLN
INTRAVENOUS | Status: DC | PRN
  Administered 2019-12-15: 120 mL

## 2019-12-15 MED ORDER — FENTANYL CITRATE (PF) 100 MCG/2ML IJ SOLN
INTRAMUSCULAR | Status: AC
  Filled 2019-12-15: qty 2

## 2019-12-15 MED ORDER — ROCURONIUM BROMIDE 10 MG/ML (PF) SYRINGE
PREFILLED_SYRINGE | INTRAVENOUS | Status: AC
  Filled 2019-12-15: qty 10

## 2019-12-15 MED ORDER — OXYCODONE HCL 5 MG PO TABS
5.0000 mg | ORAL_TABLET | ORAL | Status: DC | PRN
  Administered 2019-12-15: 10 mg via ORAL
  Administered 2019-12-15 (×2): 5 mg via ORAL
  Administered 2019-12-16 (×2): 10 mg via ORAL

## 2019-12-15 MED ORDER — LIDOCAINE 20MG/ML (2%) 15 ML SYRINGE OPTIME
INTRAMUSCULAR | Status: DC | PRN
  Administered 2019-12-15: 1.5 mg/kg/h via INTRAVENOUS

## 2019-12-15 MED ORDER — PANTOPRAZOLE SODIUM 40 MG IV SOLR
40.0000 mg | Freq: Every day | INTRAVENOUS | Status: DC
  Administered 2019-12-15: 40 mg via INTRAVENOUS

## 2019-12-15 MED ORDER — LIDOCAINE HCL 2 % IJ SOLN
INTRAMUSCULAR | Status: AC
  Filled 2019-12-15: qty 20

## 2019-12-15 MED ORDER — ROCURONIUM BROMIDE 10 MG/ML (PF) SYRINGE
PREFILLED_SYRINGE | INTRAVENOUS | Status: DC | PRN
  Administered 2019-12-15: 10 mg via INTRAVENOUS
  Administered 2019-12-15: 60 mg via INTRAVENOUS

## 2019-12-15 MED ORDER — PANTOPRAZOLE SODIUM 40 MG IV SOLR
INTRAVENOUS | Status: AC
  Filled 2019-12-15: qty 40

## 2019-12-15 MED ORDER — FENTANYL CITRATE (PF) 100 MCG/2ML IJ SOLN
25.0000 ug | INTRAMUSCULAR | Status: DC | PRN
  Administered 2019-12-15: 50 ug via INTRAVENOUS
  Administered 2019-12-15: 25 ug via INTRAVENOUS

## 2019-12-15 MED ORDER — SIMETHICONE 80 MG PO CHEW
80.0000 mg | CHEWABLE_TABLET | Freq: Four times a day (QID) | ORAL | Status: DC | PRN
  Administered 2019-12-15: 80 mg via ORAL

## 2019-12-15 MED ORDER — ONDANSETRON HCL 4 MG/2ML IJ SOLN
INTRAMUSCULAR | Status: DC | PRN
  Administered 2019-12-15: 4 mg via INTRAVENOUS

## 2019-12-15 MED ORDER — CELECOXIB 200 MG PO CAPS
ORAL_CAPSULE | ORAL | Status: AC
  Filled 2019-12-15: qty 2

## 2019-12-15 MED ORDER — SODIUM CHLORIDE 0.9 % IV SOLN
INTRAVENOUS | Status: AC
  Filled 2019-12-15: qty 2

## 2019-12-15 MED ORDER — SENNA 8.6 MG PO TABS
ORAL_TABLET | ORAL | Status: AC
  Filled 2019-12-15: qty 1

## 2019-12-15 MED ORDER — SODIUM CHLORIDE 0.9 % IV SOLN
2.0000 g | INTRAVENOUS | Status: AC
  Administered 2019-12-15: 2 g via INTRAVENOUS

## 2019-12-15 MED ORDER — SENNA 8.6 MG PO TABS
1.0000 | ORAL_TABLET | Freq: Two times a day (BID) | ORAL | Status: DC
  Administered 2019-12-15: 8.6 mg via ORAL

## 2019-12-15 MED ORDER — SODIUM CHLORIDE 0.9 % IR SOLN
Status: DC | PRN
  Administered 2019-12-15: 3000 mL

## 2019-12-15 MED ORDER — IBUPROFEN 800 MG PO TABS: 800.0000 mg | ORAL_TABLET | Freq: Three times a day (TID) | ORAL | 1 refills | Status: AC | PRN

## 2019-12-15 MED ORDER — PROPOFOL 10 MG/ML IV BOLUS
INTRAVENOUS | Status: AC
  Filled 2019-12-15: qty 20

## 2019-12-15 MED ORDER — SUGAMMADEX SODIUM 200 MG/2ML IV SOLN
INTRAVENOUS | Status: DC | PRN
  Administered 2019-12-15: 200 mg via INTRAVENOUS

## 2019-12-15 MED ORDER — ACETAMINOPHEN 500 MG PO TABS
ORAL_TABLET | ORAL | Status: AC
  Filled 2019-12-15: qty 2

## 2019-12-15 MED ORDER — OXYCODONE HCL 5 MG PO TABS
ORAL_TABLET | ORAL | Status: AC
  Filled 2019-12-15: qty 1

## 2019-12-15 MED ORDER — FENTANYL CITRATE (PF) 100 MCG/2ML IJ SOLN
50.0000 ug | INTRAMUSCULAR | Status: DC | PRN
  Administered 2019-12-15: 50 ug via INTRAVENOUS

## 2019-12-15 MED ORDER — ONDANSETRON HCL 4 MG/2ML IJ SOLN: 4.0000 mg | Freq: Once | INTRAMUSCULAR | Status: DC | PRN

## 2019-12-15 MED ORDER — SPIRONOLACTONE 50 MG PO TABS
150.0000 mg | ORAL_TABLET | Freq: Every day | ORAL | Status: DC
  Filled 2019-12-15: qty 1

## 2019-12-15 MED ORDER — ACETAMINOPHEN 500 MG PO TABS
1000.0000 mg | ORAL_TABLET | ORAL | Status: AC
  Administered 2019-12-15: 1000 mg via ORAL

## 2019-12-15 MED ORDER — LISINOPRIL 10 MG PO TABS
10.0000 mg | ORAL_TABLET | Freq: Every day | ORAL | Status: DC
  Filled 2019-12-15: qty 1

## 2019-12-15 MED ORDER — POVIDONE-IODINE 10 % EX SWAB: 2.0000 "application " | Freq: Once | CUTANEOUS | Status: DC

## 2019-12-15 MED ORDER — DEXAMETHASONE SODIUM PHOSPHATE 10 MG/ML IJ SOLN
INTRAMUSCULAR | Status: AC
  Filled 2019-12-15: qty 1

## 2019-12-15 MED ORDER — GABAPENTIN 300 MG PO CAPS
300.0000 mg | ORAL_CAPSULE | ORAL | Status: AC
  Administered 2019-12-15: 300 mg via ORAL

## 2019-12-15 MED ORDER — ZOLPIDEM TARTRATE 5 MG PO TABS: 5.0000 mg | ORAL_TABLET | Freq: Every evening | ORAL | Status: DC | PRN

## 2019-12-15 MED ORDER — PHENYLEPHRINE 40 MCG/ML (10ML) SYRINGE FOR IV PUSH (FOR BLOOD PRESSURE SUPPORT)
PREFILLED_SYRINGE | INTRAVENOUS | Status: DC | PRN
  Administered 2019-12-15 (×2): 80 ug via INTRAVENOUS

## 2019-12-15 MED ORDER — DULOXETINE HCL 30 MG PO CPEP
90.0000 mg | ORAL_CAPSULE | Freq: Every day | ORAL | Status: DC
  Filled 2019-12-15: qty 3

## 2019-12-15 MED ORDER — PROPOFOL 10 MG/ML IV BOLUS
INTRAVENOUS | Status: DC | PRN
  Administered 2019-12-15: 160 mg via INTRAVENOUS

## 2019-12-15 MED ORDER — DEXAMETHASONE SODIUM PHOSPHATE 10 MG/ML IJ SOLN
INTRAMUSCULAR | Status: DC | PRN
  Administered 2019-12-15: 10 mg via INTRAVENOUS

## 2019-12-15 MED ORDER — KETAMINE HCL 10 MG/ML IJ SOLN
INTRAMUSCULAR | Status: DC | PRN
  Administered 2019-12-15: 30 mg via INTRAVENOUS

## 2019-12-15 MED ORDER — ONDANSETRON HCL 4 MG PO TABS: 4.0000 mg | ORAL_TABLET | Freq: Four times a day (QID) | ORAL | Status: DC | PRN

## 2019-12-15 MED ORDER — MIDAZOLAM HCL 5 MG/5ML IJ SOLN
INTRAMUSCULAR | Status: DC | PRN
  Administered 2019-12-15: 2 mg via INTRAVENOUS

## 2019-12-15 MED ORDER — ALUM & MAG HYDROXIDE-SIMETH 200-200-20 MG/5ML PO SUSP: 30.0000 mL | ORAL | Status: DC | PRN

## 2019-12-15 MED ORDER — OXYCODONE HCL 5 MG PO TABS
ORAL_TABLET | ORAL | Status: AC
  Filled 2019-12-15: qty 2

## 2019-12-15 SURGICAL SUPPLY — 70 items
ADH SKN CLS APL DERMABOND .7 (GAUZE/BANDAGES/DRESSINGS) ×1
APL SRG 38 LTWT LNG FL B (MISCELLANEOUS) ×1
APPLICATOR ARISTA FLEXITIP XL (MISCELLANEOUS) ×3 IMPLANT
BAG DRN RND TRDRP ANRFLXCHMBR (UROLOGICAL SUPPLIES) ×2
BAG URINE DRAIN 2000ML AR STRL (UROLOGICAL SUPPLIES) ×6 IMPLANT
BARRIER ADHS 3X4 INTERCEED (GAUZE/BANDAGES/DRESSINGS) IMPLANT
BRR ADH 4X3 ABS CNTRL BYND (GAUZE/BANDAGES/DRESSINGS)
CATH FOLEY 3WAY  5CC 16FR (CATHETERS) ×3
CATH FOLEY 3WAY 5CC 16FR (CATHETERS) ×1 IMPLANT
CELLS DAT CNTRL 66122 CELL SVR (MISCELLANEOUS) ×1 IMPLANT
COVER BACK TABLE 60X90IN (DRAPES) ×3 IMPLANT
COVER TIP SHEARS 8 DVNC (MISCELLANEOUS) ×1 IMPLANT
COVER TIP SHEARS 8MM DA VINCI (MISCELLANEOUS) ×3
DECANTER SPIKE VIAL GLASS SM (MISCELLANEOUS) ×6 IMPLANT
DEFOGGER SCOPE WARMER CLEARIFY (MISCELLANEOUS) ×3 IMPLANT
DERMABOND ADVANCED (GAUZE/BANDAGES/DRESSINGS) ×2
DERMABOND ADVANCED .7 DNX12 (GAUZE/BANDAGES/DRESSINGS) ×1 IMPLANT
DILATOR CANAL MILEX (MISCELLANEOUS) ×3 IMPLANT
DRAPE ARM DVNC X/XI (DISPOSABLE) ×4 IMPLANT
DRAPE COLUMN DVNC XI (DISPOSABLE) ×1 IMPLANT
DRAPE DA VINCI XI ARM (DISPOSABLE) ×12
DRAPE DA VINCI XI COLUMN (DISPOSABLE) ×3
DRAPE UTILITY 15X26 TOWEL STRL (DRAPES) ×3 IMPLANT
DURAPREP 26ML APPLICATOR (WOUND CARE) ×3 IMPLANT
ELECT REM PT RETURN 9FT ADLT (ELECTROSURGICAL) ×3
ELECTRODE REM PT RTRN 9FT ADLT (ELECTROSURGICAL) ×1 IMPLANT
GLOVE BIOGEL M 7.0 STRL (GLOVE) ×9 IMPLANT
GLOVE BIOGEL PI IND STRL 7.0 (GLOVE) ×6 IMPLANT
GLOVE BIOGEL PI INDICATOR 7.0 (GLOVE) ×12
HEMOSTAT ARISTA ABSORB 3G PWDR (HEMOSTASIS) ×3 IMPLANT
HOLDER FOLEY CATH W/STRAP (MISCELLANEOUS) IMPLANT
IRRIG SUCT STRYKERFLOW 2 WTIP (MISCELLANEOUS) ×3
IRRIGATION SUCT STRKRFLW 2 WTP (MISCELLANEOUS) ×1 IMPLANT
KIT TURNOVER CYSTO (KITS) ×3 IMPLANT
LEGGING LITHOTOMY PAIR STRL (DRAPES) ×3 IMPLANT
OBTURATOR OPTICAL STANDARD 8MM (TROCAR) ×3
OBTURATOR OPTICAL STND 8 DVNC (TROCAR) ×1
OBTURATOR OPTICALSTD 8 DVNC (TROCAR) ×1 IMPLANT
OCCLUDER COLPOPNEUMO (BALLOONS) ×3 IMPLANT
PACK ROBOT WH (CUSTOM PROCEDURE TRAY) ×3 IMPLANT
PACK ROBOTIC GOWN (GOWN DISPOSABLE) ×3 IMPLANT
PACK TRENDGUARD 450 HYBRID PRO (MISCELLANEOUS) ×1 IMPLANT
PAD OB MATERNITY 4.3X12.25 (PERSONAL CARE ITEMS) ×3 IMPLANT
PAD PREP 24X48 CUFFED NSTRL (MISCELLANEOUS) ×3 IMPLANT
PROTECTOR NERVE ULNAR (MISCELLANEOUS) ×3 IMPLANT
RTRCTR WOUND ALEXIS 18CM MED (MISCELLANEOUS) ×3
RTRCTR WOUND ALEXIS 18CM SML (INSTRUMENTS)
SAVER CELL AAL HAEMONETICS (INSTRUMENTS) IMPLANT
SCISSORS LAP 5X45 EPIX DISP (ENDOMECHANICALS) IMPLANT
SEAL CANN UNIV 5-8 DVNC XI (MISCELLANEOUS) ×4 IMPLANT
SEAL XI 5MM-8MM UNIVERSAL (MISCELLANEOUS) ×12
SEALER VESSEL DA VINCI XI (MISCELLANEOUS) ×3
SEALER VESSEL EXT DVNC XI (MISCELLANEOUS) ×1 IMPLANT
SET IRRIG Y TYPE TUR BLADDER L (SET/KITS/TRAYS/PACK) IMPLANT
SET TRI-LUMEN FLTR TB AIRSEAL (TUBING) ×3 IMPLANT
SUT VIC AB 0 CT1 27 (SUTURE) ×6
SUT VIC AB 0 CT1 27XBRD ANBCTR (SUTURE) ×2 IMPLANT
SUT VICRYL 0 UR6 27IN ABS (SUTURE) IMPLANT
SUT VICRYL RAPIDE 4/0 PS 2 (SUTURE) ×9 IMPLANT
SUT VLOC 180 0 9IN  GS21 (SUTURE) ×3
SUT VLOC 180 0 9IN GS21 (SUTURE) ×1 IMPLANT
TIP RUMI ORANGE 6.7MMX12CM (TIP) IMPLANT
TIP UTERINE 5.1X6CM LAV DISP (MISCELLANEOUS) IMPLANT
TIP UTERINE 6.7X10CM GRN DISP (MISCELLANEOUS) ×3 IMPLANT
TIP UTERINE 6.7X6CM WHT DISP (MISCELLANEOUS) IMPLANT
TIP UTERINE 6.7X8CM BLUE DISP (MISCELLANEOUS) IMPLANT
TOWEL OR 17X26 10 PK STRL BLUE (TOWEL DISPOSABLE) ×3 IMPLANT
TRENDGUARD 450 HYBRID PRO PACK (MISCELLANEOUS) ×3
TROCAR PORT AIRSEAL 8X120 (TROCAR) ×3 IMPLANT
WATER STERILE IRR 1000ML POUR (IV SOLUTION) ×3 IMPLANT

## 2019-12-15 NOTE — Op Note (Signed)
12/15/2019  4:05 PM  PATIENT:  Alexandria Cole  39 y.o. female  PRE-OPERATIVE DIAGNOSIS:  D25.9 Fibroids D06.9 CIN III  POST-OPERATIVE DIAGNOSIS:  D25.9 Fibroids D06.9 CIN III   PROCEDURE:  Procedure(s) with comments: XI ROBOTIC ASSISTED LAPAROSCOPIC HYSTERECTOMY AND BILATERAL SALPINGECTOMY (Bilateral) - Tracie to RNFA confirmed on 11/03/19 CS  SURGEON:  Surgeon(s) and Role:    Christophe Louis, MD - Primary  PHYSICIAN ASSISTANT: Gaylord Shih RNFA  ASSISTANTS: none   ANESTHESIA:   general  EBL:  25 mL   BLOOD ADMINISTERED:none  DRAINS: Urinary Catheter (Foley)   LOCAL MEDICATIONS USED:  Ropivacaine   SPECIMEN:  Source of Specimen:  uterus cervix and bilateral fallopian tubes   DISPOSITION OF SPECIMEN:  PATHOLOGY  COUNTS:  YES  TOURNIQUET:  * No tourniquets in log *  DICTATION: .Dragon Dictation  PLAN OF CARE: Admit for overnight observation  PATIENT DISPOSITION:  PACU - hemodynamically stable.   Delay start of Pharmacological VTE agent (>24 hrs) due to surgical blood loss or risk of bleeding: not applicable  Specimen weight 534 grams.    Findings: Normal external genitalia, normal appearing cervix. Uterus sounded to 11 cm... Large fibroid uterus. Adhesions of the omentum to the anterior abdominal wall.   Procedure: The patient was taken to the operating room where she was placed under general anesthesia.Time out was performed. Marland Kitchen She was placed in dorsal lithotomy position and prepped and draped in the usual sterile fashion. A weighted speculum was placed into the vagina. A Deaver was placed anteriorly for retraction. The anterior lip of the cervix was grasped with a single-tooth tenaculum. The vaginal mucosa was injected with 2.5 cc of ropivacaine at the 2/4/ 8 and 10 o'clock positions. The uterus was sounded to 11 cm. the cervix was dilated to 6 mm . 0 vicryl suture placed at the 12 and 6:00 positions Of the cervix to facilitate placement of a Ru mi uterine  manipulator. The manipulator was placed without difficulty. Weighted speculum and Deaver were removed .  Attention was turned to the patient's abdomen where a 8 mm trocar was placed 2 cm above the umbilicus. under direct visualization . The pneumoperitoneum was achieved with PCO2 gas. The laparoscope was removed. 60 cc of ropivacaine were injected into the abdominal cavity. The laparoscope was reinserted. An 8 mm trocar was placed in the right upper quadrant 16 centimeters from the umbilicus.later connected to robotic arm #4). An 8MM incision was made in the Right upper quadrant TROCAR WAS PLACED 8 cm from the umbilicus. Later connected to robotic arm #3. An 8 mm incision was made in the left upper quadrant 16 cm from the umbilicus and connected to robot arm #1. Marland Kitchen Attention was turned to the left upper quadrant where a 8 mm midclavicular assistant trocar was placed. ( All incision sites were injected with 10 cc of ropivacaine prior to port placement. )  Once all ports had been placed under direct visualization.The laparoscope was removed and the Chilo robotic system was thin right-sided docked. The robotic arms were connected to the corresponding trocars as listed above. The laparoscope was then reinserted. The long tip bipolar forceps were placed into port #1. The pro grasp placed in the port #4. A vessel sealer was placed in port #3.( replaced with scissors when needed)  All instruments were directed into the pelvis under direct visualization.  Attention was turned to the surgeons console..  The adhesions of the omentum to the anterior abdominal wall were excised  with the vessel sealer. The left mesosalpinx and left utero-ovarian ligament was cauterized and transected with the vessel sealer The broad ligament was cauterized and transected with the vessel sealer .The round ligament was cauterized and transected with the vessel sealer  The anterior leaf of broad ligament was incised along the bladder  reflection to the midline.  The right  mesosalpinx and right utero-ovarian ligament was cauterized and transected with the vessel sealer. The right broad ligament was cauterized and transected with the vessel sealer. The right round ligament was cauterized and transected with the vessel sealer The broad ligament was incised to the midline. The bladder was dissected off the lower uterine segments of the cervix via sharp and blunt dissection.   The uterine arteries were skeleton bilaterally. They were  cauterized and transected with the vessel sealer The KOH ring was identified. The anterior colpotomy was performed followed by the posterior colpotomy. Once the uterus,cervix and bilateral fallopian tubes were completely excised they were removed from the vagina. An alexis retractor was placed in abdominal cavity through the vagina. Due to the size of the uterus it was morcellated through the vagina using scalpel to facilitate removal.   Attention was again turned to the surgeons console.   The  bipolar forceps and scissors were removed and log tip forceps were placed in the port #1 and the needle driver was placed in to port #3.  The vaginal cuff was closed with running suture if 0 v-lock. The pelvis was irrigated. Marland KitchenMarland KitchenMarland KitchenExcellent hemostasis was noted. Arista was placed along the vaginal cuff.  All pelvic pedicles were examined and hemostasis was noted.  All instruments removed from the ports. All ports were removed under direct Visualization. The pneumoperitoneum was released. The skin incisions were closed with 4-0 Vicryl and then covered with Derma bond.     Sponge lap and needle counts were correct x. The patient was awakened from anesthesia and taken to the recovery room in stable condition.

## 2019-12-15 NOTE — Transfer of Care (Signed)
Immediate Anesthesia Transfer of Care Note  Patient: Alexandria Cole  Procedure(s) Performed: XI ROBOTIC ASSISTED LAPAROSCOPIC HYSTERECTOMY AND BILATERAL SALPINGECTOMY (Bilateral )  Patient Location: PACU  Anesthesia Type:General  Level of Consciousness: awake and alert   Airway & Oxygen Therapy: Patient Spontanous Breathing and Patient connected to face mask oxygen  Post-op Assessment: Report given to RN and Post -op Vital signs reviewed and stable  Post vital signs: Reviewed and stable  Last Vitals:  Vitals Value Taken Time  BP 152/77 12/15/19 1612  Temp    Pulse 89 12/15/19 1614  Resp 20 12/15/19 1614  SpO2 92 % 12/15/19 1614  Vitals shown include unvalidated device data.  Last Pain:  Vitals:   12/15/19 1014  TempSrc: Oral  PainSc: 0-No pain      Patients Stated Pain Goal: 5 (39/68/86 4847)  Complications: No complications documented.

## 2019-12-15 NOTE — H&P (Signed)
Date of Initial H&P: 12/12/2019 History reviewed, patient examined, no change in status, stable for surgery.

## 2019-12-15 NOTE — Anesthesia Procedure Notes (Signed)
Procedure Name: Intubation Date/Time: 12/15/2019 1:01 PM Performed by: Lashan Macias D, CRNA Pre-anesthesia Checklist: Patient identified, Emergency Drugs available, Suction available and Patient being monitored Patient Re-evaluated:Patient Re-evaluated prior to induction Oxygen Delivery Method: Circle system utilized Preoxygenation: Pre-oxygenation with 100% oxygen Induction Type: IV induction Ventilation: Mask ventilation without difficulty Laryngoscope Size: Mac and 3 Grade View: Grade I Tube type: Oral Tube size: 7.0 mm Number of attempts: 1 Airway Equipment and Method: Stylet Placement Confirmation: ETT inserted through vocal cords under direct vision,  positive ETCO2 and breath sounds checked- equal and bilateral Secured at: 21 cm Tube secured with: Tape Dental Injury: Teeth and Oropharynx as per pre-operative assessment

## 2019-12-15 NOTE — Anesthesia Preprocedure Evaluation (Signed)
Anesthesia Evaluation  Patient identified by MRN, date of birth, ID band Patient awake    Reviewed: Allergy & Precautions, NPO status , Patient's Chart, lab work & pertinent test results  History of Anesthesia Complications Negative for: history of anesthetic complications  Airway Mallampati: II  TM Distance: >3 FB Neck ROM: Full    Dental  (+) Partial Upper   Pulmonary asthma , former smoker,    Pulmonary exam normal        Cardiovascular hypertension, Pt. on medications negative cardio ROS Normal cardiovascular exam     Neuro/Psych Anxiety Depression negative neurological ROS     GI/Hepatic Neg liver ROS, GERD  ,  Endo/Other  diabetesMorbid obesity  Renal/GU Renal disease (Cr 1.39)  negative genitourinary   Musculoskeletal negative musculoskeletal ROS (+)   Abdominal   Peds  Hematology  (+) anemia , Hgb 11.5   Anesthesia Other Findings   Reproductive/Obstetrics Fibroids & recurrent cervical dysplasia                            Anesthesia Physical Anesthesia Plan  ASA: III  Anesthesia Plan: General   Post-op Pain Management:    Induction: Intravenous  PONV Risk Score and Plan: 4 or greater and Ondansetron, Dexamethasone, Treatment may vary due to age or medical condition and Midazolam  Airway Management Planned: Oral ETT  Additional Equipment: None  Intra-op Plan:   Post-operative Plan: Extubation in OR  Informed Consent: I have reviewed the patients History and Physical, chart, labs and discussed the procedure including the risks, benefits and alternatives for the proposed anesthesia with the patient or authorized representative who has indicated his/her understanding and acceptance.     Dental advisory given  Plan Discussed with:   Anesthesia Plan Comments:         Anesthesia Quick Evaluation

## 2019-12-15 NOTE — Progress Notes (Signed)
Dr called unit about BP pt had just moved over from stretcher to. Going into retake now will continue you to monitor bed

## 2019-12-16 DIAGNOSIS — D259 Leiomyoma of uterus, unspecified: Secondary | ICD-10-CM | POA: Diagnosis not present

## 2019-12-16 LAB — CBC
HCT: 36.8 % (ref 36.0–46.0)
Hemoglobin: 10.8 g/dL — ABNORMAL LOW (ref 12.0–15.0)
MCH: 22 pg — ABNORMAL LOW (ref 26.0–34.0)
MCHC: 29.3 g/dL — ABNORMAL LOW (ref 30.0–36.0)
MCV: 74.8 fL — ABNORMAL LOW (ref 80.0–100.0)
Platelets: 251 10*3/uL (ref 150–400)
RBC: 4.92 MIL/uL (ref 3.87–5.11)
RDW: 18.2 % — ABNORMAL HIGH (ref 11.5–15.5)
WBC: 14 10*3/uL — ABNORMAL HIGH (ref 4.0–10.5)
nRBC: 0 % (ref 0.0–0.2)

## 2019-12-16 MED ORDER — OXYCODONE HCL 5 MG PO TABS
ORAL_TABLET | ORAL | Status: AC
  Filled 2019-12-16: qty 2

## 2019-12-16 MED ORDER — IBUPROFEN 800 MG PO TABS
ORAL_TABLET | ORAL | Status: AC
  Filled 2019-12-16: qty 1

## 2019-12-16 NOTE — Anesthesia Postprocedure Evaluation (Signed)
Anesthesia Post Note  Patient: Science writer  Procedure(s) Performed: XI ROBOTIC ASSISTED LAPAROSCOPIC HYSTERECTOMY AND BILATERAL SALPINGECTOMY (Bilateral )     Patient location during evaluation: PACU Anesthesia Type: General Level of consciousness: awake and alert Pain management: pain level controlled Vital Signs Assessment: post-procedure vital signs reviewed and stable Respiratory status: spontaneous breathing, nonlabored ventilation, respiratory function stable and patient connected to nasal cannula oxygen Cardiovascular status: blood pressure returned to baseline and stable Postop Assessment: no apparent nausea or vomiting Anesthetic complications: no   No complications documented.  Last Vitals:  Vitals:   12/16/19 0550 12/16/19 0831  BP: 131/65 (!) 144/83  Pulse: 95 95  Resp: 16 20  Temp: 37.2 C 37.3 C  SpO2: 97% 99%    Last Pain:  Vitals:   12/16/19 0831  TempSrc:   PainSc: 4                  Lidia Collum

## 2019-12-16 NOTE — Discharge Summary (Signed)
Physician Discharge Summary  Patient ID: Alexandria Cole MRN: [id number] DOB/AGE: 1980/10/07 39 y.o.  Admit date: 12/15/2019 Discharge date: 12/16/2019  Admission Diagnoses: Menorrhagia / Uterine Fibroids/ Recurrent Cervical dysplasia   Discharge Diagnoses: Same  Active Problems:   S/P hysterectomy   Discharged Condition: good  Hospital Course: pt was admitted for observation after undergoing a robotic assisted laparoscopic hysterectomy with bilateral salpingectomy. She did well postoperatively with return of bowel and bladder function. She is tolerating po and ambulating. Pain is well controlled.   Consults: None  Significant Diagnostic Studies: labs: hgb pod #1 10.8   Treatments: surgery: robotic assisted laparoscopic hysterectomy with bilateral salpingectomy   Discharge Exam: Blood pressure 131/65, pulse 95, temperature 99 F (37.2 C), resp. rate 16, height 5\' 3"  (1.6 m), weight 105.5 kg, last menstrual period 12/10/2019, SpO2 97 %. General appearance: alert, cooperative and no distress Resp: non labored no use of accessory muscles  GI: soft appropriately tender nondistended no rebound or guarding  Extremities: extremities normal, atraumatic, no cyanosis or edema Incision/Wound: wound edges are well approximated no erythema or exudate   Disposition: Discharge disposition: 01-Home or Self Care       Discharge Instructions    Call MD for:  persistant nausea and vomiting   Complete by: As directed    Call MD for:  redness, tenderness, or signs of infection (pain, swelling, redness, odor or green/yellow discharge around incision site)   Complete by: As directed    Call MD for:  severe uncontrolled pain   Complete by: As directed    Call MD for:  temperature >100.4   Complete by: As directed    Diet - low sodium heart healthy   Complete by: As directed    Driving Restrictions   Complete by: As directed    Avoid driving for 1 week   Increase activity slowly    Complete by: As directed    Lifting restrictions   Complete by: As directed    Avoid lifting over 10 lbs   May shower / Bathe   Complete by: As directed    May walk up steps   Complete by: As directed    No wound care   Complete by: As directed    Sexual Activity Restrictions   Complete by: As directed    Avoid sex for 6 weeks and until approved by Dr. Landry Mellow     Allergies as of 12/16/2019      Reactions   Flagyl [metronidazole] Nausea And Vomiting   Hydromorphone Itching   Other Other (See Comments)   Raw fruits and vegetables cause an itchy throat      Medication List    TAKE these medications   albuterol 108 (90 Base) MCG/ACT inhaler Commonly known as: VENTOLIN HFA Inhale 2 puffs into the lungs every 6 (six) hours as needed (allergies).   amLODipine 10 MG tablet Commonly known as: NORVASC Take 10 mg by mouth daily.   calcium carbonate 500 MG chewable tablet Commonly known as: TUMS - dosed in mg elemental calcium Chew 1 tablet by mouth daily as needed for heartburn.   cetirizine 10 MG tablet Commonly known as: ZYRTEC Take 10 mg by mouth daily as needed for allergies.   chlorthalidone 50 MG tablet Commonly known as: HYGROTON Take 50 mg by mouth daily.   doxepin 50 MG capsule Commonly known as: SINEQUAN Take 50 mg by mouth daily.   DULoxetine HCl 30 MG Csdr Take 90 mg by mouth daily.  ibuprofen 800 MG tablet Commonly known as: ADVIL Take 1 tablet (800 mg total) by mouth every 8 (eight) hours as needed.   lisinopril 10 MG tablet Commonly known as: ZESTRIL Take 10 mg by mouth daily.   oxyCODONE 5 MG immediate release tablet Commonly known as: Oxy IR/ROXICODONE Take 1-2 tablets (5-10 mg total) by mouth every 4 (four) hours as needed for up to 7 days for moderate pain.   QUEtiapine 25 MG tablet Commonly known as: SEROQUEL Take 25 mg by mouth daily.   spironolactone 50 MG tablet Commonly known as: ALDACTONE Take 150 mg by mouth daily.   Wellbutrin XL  300 MG 24 hr tablet Generic drug: buPROPion Take 300 mg by mouth every morning.       Follow-up Information    Christophe Louis, MD. Go in 2 week(s).   Specialty: Obstetrics and Gynecology Why: patient should already have an appointment scheduled in 2 weeks for postoperative appointment Contact information: [address] [place] [address] [phone]               Signed: Christophe Louis 12/16/2019, 7:49 AM

## 2019-12-16 NOTE — Discharge Instructions (Signed)

## 2019-12-17 ENCOUNTER — Encounter (HOSPITAL_BASED_OUTPATIENT_CLINIC_OR_DEPARTMENT_OTHER): Payer: Self-pay | Admitting: Obstetrics and Gynecology

## 2019-12-20 LAB — SURGICAL PATHOLOGY

## 2023-02-13 ENCOUNTER — Other Ambulatory Visit: Payer: Self-pay

## 2023-02-14 LAB — SURGICAL PATHOLOGY

## 2023-08-15 ENCOUNTER — Telehealth: Payer: Self-pay

## 2023-08-15 NOTE — Telephone Encounter (Signed)
 Copied from CRM [id number]. Topic: Clinical - Home Health Verbal Orders >> Aug 15, 2023 12:04 PM Miquel SAILOR wrote: Caller/Agency: Mallie from [facility]  Callback Number: [phone] Service Requested: Physical Therapy and Speech Therapy Frequency: PT extending 1 week for 9 weeks SP will send evaluation  Any new concerns about the patient? No

## 2023-08-15 NOTE — Telephone Encounter (Signed)
 Message sent to wrong office, will return to sender to reroute
